# Patient Record
Sex: Female | Born: 1975 | Race: Black or African American | Hispanic: No | Marital: Single | Smoking: Never smoker
Health system: Southern US, Community
[De-identification: ages and names within clinical notes are randomized; demographics above are authoritative.]

## PROBLEM LIST (undated history)

## (undated) DIAGNOSIS — R519 Headache, unspecified: Secondary | ICD-10-CM

## (undated) DIAGNOSIS — K219 Gastro-esophageal reflux disease without esophagitis: Secondary | ICD-10-CM

## (undated) DIAGNOSIS — G40909 Epilepsy, unspecified, not intractable, without status epilepticus: Secondary | ICD-10-CM

## (undated) DIAGNOSIS — M25569 Pain in unspecified knee: Secondary | ICD-10-CM

## (undated) DIAGNOSIS — O139 Gestational [pregnancy-induced] hypertension without significant proteinuria, unspecified trimester: Secondary | ICD-10-CM

## (undated) DIAGNOSIS — M549 Dorsalgia, unspecified: Secondary | ICD-10-CM

## (undated) DIAGNOSIS — L709 Acne, unspecified: Secondary | ICD-10-CM

## (undated) DIAGNOSIS — Z973 Presence of spectacles and contact lenses: Secondary | ICD-10-CM

## (undated) DIAGNOSIS — Z87440 Personal history of urinary (tract) infections: Secondary | ICD-10-CM

## (undated) DIAGNOSIS — R51 Headache: Secondary | ICD-10-CM

## (undated) HISTORY — DX: Epilepsy, unspecified, not intractable, without status epilepticus: G40.909

## (undated) HISTORY — DX: Pain in unspecified knee: M25.569

## (undated) HISTORY — DX: Gastro-esophageal reflux disease without esophagitis: K21.9

## (undated) HISTORY — DX: Presence of spectacles and contact lenses: Z97.3

## (undated) HISTORY — DX: Personal history of urinary (tract) infections: Z87.440

## (undated) HISTORY — DX: Headache: R51

## (undated) HISTORY — DX: Headache, unspecified: R51.9

## (undated) HISTORY — DX: Acne, unspecified: L70.9

## (undated) HISTORY — DX: Dorsalgia, unspecified: M54.9

---

## 2000-12-19 DIAGNOSIS — M549 Dorsalgia, unspecified: Secondary | ICD-10-CM

## 2000-12-19 HISTORY — DX: Dorsalgia, unspecified: M54.9

## 2001-06-14 ENCOUNTER — Encounter: Payer: Self-pay | Admitting: Family Medicine

## 2001-06-14 ENCOUNTER — Encounter: Admission: RE | Admit: 2001-06-14 | Discharge: 2001-06-14 | Payer: Self-pay | Admitting: Family Medicine

## 2002-02-27 ENCOUNTER — Ambulatory Visit (HOSPITAL_COMMUNITY): Admission: RE | Admit: 2002-02-27 | Discharge: 2002-02-27 | Payer: Self-pay | Admitting: Obstetrics and Gynecology

## 2002-02-27 ENCOUNTER — Encounter (INDEPENDENT_AMBULATORY_CARE_PROVIDER_SITE_OTHER): Payer: Self-pay

## 2003-04-15 ENCOUNTER — Encounter: Payer: Self-pay | Admitting: Family Medicine

## 2003-04-15 ENCOUNTER — Encounter: Admission: RE | Admit: 2003-04-15 | Discharge: 2003-04-15 | Payer: Self-pay | Admitting: Family Medicine

## 2003-09-18 ENCOUNTER — Other Ambulatory Visit: Admission: RE | Admit: 2003-09-18 | Discharge: 2003-09-18 | Payer: Self-pay | Admitting: Family Medicine

## 2004-12-23 ENCOUNTER — Other Ambulatory Visit: Admission: RE | Admit: 2004-12-23 | Discharge: 2004-12-23 | Payer: Self-pay | Admitting: Family Medicine

## 2005-04-21 ENCOUNTER — Other Ambulatory Visit: Admission: RE | Admit: 2005-04-21 | Discharge: 2005-04-21 | Payer: Self-pay | Admitting: Obstetrics and Gynecology

## 2005-10-23 ENCOUNTER — Inpatient Hospital Stay (HOSPITAL_COMMUNITY): Admission: AD | Admit: 2005-10-23 | Discharge: 2005-10-23 | Payer: Self-pay | Admitting: Obstetrics and Gynecology

## 2005-10-27 ENCOUNTER — Inpatient Hospital Stay (HOSPITAL_COMMUNITY): Admission: AD | Admit: 2005-10-27 | Discharge: 2005-10-27 | Payer: Self-pay | Admitting: Obstetrics and Gynecology

## 2005-10-28 ENCOUNTER — Encounter (INDEPENDENT_AMBULATORY_CARE_PROVIDER_SITE_OTHER): Payer: Self-pay | Admitting: Specialist

## 2005-10-28 ENCOUNTER — Inpatient Hospital Stay (HOSPITAL_COMMUNITY): Admission: RE | Admit: 2005-10-28 | Discharge: 2005-11-02 | Payer: Self-pay | Admitting: Obstetrics and Gynecology

## 2005-12-19 DIAGNOSIS — M25569 Pain in unspecified knee: Secondary | ICD-10-CM

## 2005-12-19 HISTORY — DX: Pain in unspecified knee: M25.569

## 2006-02-07 ENCOUNTER — Other Ambulatory Visit: Admission: RE | Admit: 2006-02-07 | Discharge: 2006-02-07 | Payer: Self-pay | Admitting: Obstetrics and Gynecology

## 2006-08-30 ENCOUNTER — Other Ambulatory Visit: Admission: RE | Admit: 2006-08-30 | Discharge: 2006-08-30 | Payer: Self-pay | Admitting: Obstetrics and Gynecology

## 2007-03-12 ENCOUNTER — Other Ambulatory Visit: Admission: RE | Admit: 2007-03-12 | Discharge: 2007-03-12 | Payer: Self-pay | Admitting: Obstetrics and Gynecology

## 2007-06-18 ENCOUNTER — Other Ambulatory Visit: Admission: RE | Admit: 2007-06-18 | Discharge: 2007-06-18 | Payer: Self-pay | Admitting: Obstetrics & Gynecology

## 2008-01-02 ENCOUNTER — Other Ambulatory Visit: Admission: RE | Admit: 2008-01-02 | Discharge: 2008-01-02 | Payer: Self-pay | Admitting: Obstetrics and Gynecology

## 2008-06-25 ENCOUNTER — Other Ambulatory Visit: Admission: RE | Admit: 2008-06-25 | Discharge: 2008-06-25 | Payer: Self-pay | Admitting: Obstetrics and Gynecology

## 2009-01-29 ENCOUNTER — Other Ambulatory Visit: Admission: RE | Admit: 2009-01-29 | Discharge: 2009-01-29 | Payer: Self-pay | Admitting: Obstetrics and Gynecology

## 2009-11-11 ENCOUNTER — Ambulatory Visit: Payer: Self-pay | Admitting: Family Medicine

## 2010-02-16 ENCOUNTER — Ambulatory Visit: Payer: Self-pay | Admitting: Family Medicine

## 2011-02-07 ENCOUNTER — Institutional Professional Consult (permissible substitution) (INDEPENDENT_AMBULATORY_CARE_PROVIDER_SITE_OTHER): Payer: 59 | Admitting: Family Medicine

## 2011-02-07 DIAGNOSIS — K5289 Other specified noninfective gastroenteritis and colitis: Secondary | ICD-10-CM

## 2011-02-07 DIAGNOSIS — L708 Other acne: Secondary | ICD-10-CM

## 2011-02-07 DIAGNOSIS — R350 Frequency of micturition: Secondary | ICD-10-CM

## 2011-05-06 NOTE — H&P (Signed)
[facility]  Patient:    ANTHONY, ROLAND Visit Number: [id number] MRN: [id number]          Service Type: DSU Location: [facility] Attending Physician:  Leonard Schwartz Dictated by:   Janine Limbo, M.D. Admit Date:  02/27/2002   CC:         Elsworth Soho, M.D.   History and Physical  HISTORY OF PRESENT ILLNESS:   The patient is a 35 year old female, para 0, who presents with irregular bleeding and an endometrial polyp.   A hydrosonogram was performed and it showed that there was a 1 cm area that was thought to be consistent with a polyp.  The patient had an ultrasound performed in June 2002 and there was evidence of a polyp at that time as well.  The ovaries were thought to be unremarkable.  The uterus measured 7.4 x 5 cm in size.  DRUG ALLERGIES:               None known.  PAST MEDICAL HISTORY:         The patient has a history of headaches, she also has a history of anemia.  She has had chicken pox in the past.  CURRENT MEDICATIONS: 1. Steroids for her skin. 2. Depo Provera for contraception.  SOCIAL HISTORY:               The patient denies cigarette use, alcohol use, and recreational drug use.  REVIEW OF SYSTEMS:            The patient wears glasses.  She has frequent headaches.  FAMILY HISTORY:               The patient has a family history of hypertension, diabetes, and joint problems.  PHYSICAL EXAMINATION:  GENERAL:                      Weight is 172 pounds.  HEENT:                        Within normal limits.  CHEST:                        Clear.  HEART:                        Regular rate and rhythm.  BREASTS:                      Without masses.  ABDOMEN:                      Nontender.  EXTREMITIES:                  Within normal limits.  NEUROLOGIC EXAMINATION:       Grossly normal.  PELVIC EXAMINATION:           External genitalia are normal.  The vagina is normal.  The cervix is nontender.  The  uterus is normal size, shape, and consistency.  Adnexa - no masses.  ASSESSMENT:                   1. Irregular bleeding.                               2. Endometrial polyp.  PLAN:                         The patient will undergo hysteroscopy with dilatation and curettage.  She understands the indications for her procedure and she accepts the risks of, but not limited to, anesthetic complications, bleeding, infections, and possible damage to the surrounding organs. Dictated by:   Janine Limbo, M.D. Attending Physician:  Leonard Schwartz DD:  02/26/02 TD:  02/26/02 Job: [phone] [id number]

## 2011-05-06 NOTE — H&P (Signed)
NAME:  Dawn Goodwin, GLANZ NO.:  [id number]   MEDICAL RECORD NO.:  [id number]          PATIENT TYPE:  INP   LOCATION:  NA                            [facility]ILITY[facility]  [facility]   PHYSICIAN:  Charles A. Delcambre, MDDATE OF BIRTH:  [birth year]   DATE OF ADMISSION:  DATE OF DISCHARGE:                                HISTORY & PHYSICAL   She is to be admitted on October 28, 2005, to undergo cesarean section  secondary to macrosomia and borderline low AFI that she is quite anxious  about, refusing almost to leave the hospital. Originally scheduled for  cesarean section on November 14 but refusing to wait until that time  adamantly. She also states she is having so much pain from the pressure that  she refuses to wait until that time. She has estimated fetal weight on  verbal report to me of 5000 g, 10 pounds 10 ounces, with narrow pelvis, and  for this reason I would elect for and recommend primary cesarean section.  Amniocentesis would be difficult with an AFI in the 8 range as well, and for  this reason we will proceed without amniocentesis. She accepts risks of  prematurity, immature lungs, respiratory distress syndrome, and understands  that we are proceeding for delivery at 38 weeks 3 days estimated gestational  age. EDC is November 08, 2005. She is a 35 year old gravida 1 para 0-0-0-0.   PAST MEDICAL HISTORY:  None.   SURGICAL HISTORY:  Back injury, D&C.   MEDICATIONS:  Prenatal vitamins, Reglan and Phenergan earlier; only prenatal  vitamins at this time.   ALLERGIES:  No known drug allergies.   SOCIAL HISTORY:  No tobacco, ethanol, or drug abuse, or STD history. Not  married, not sexually active at this time. Chlamydia treated initially in  the pregnancy and test of cure was negative.   FAMILY HISTORY:  Hypertension, diabetes; otherwise, no major illnesses.   REVIEW OF SYSTEMS:  Active fetal movement is noted. She is contracting every  5-10 minutes, feels these very  minimally. No rupture of membranes or  bleeding. No headache, scotomata, right upper quadrant pain, fever, chills,  nausea, vomiting, or constipation. She does have slight diarrhea at this  time, has been instructed that she could take Imodium but has not taken any.   LABORATORY:  Blood type A negative, antibody screen negative. VDRL  nonreactive. Rubella immune. Hepatitis B surface antigen negative. HIV  negative. TSH normal. Pap:  ASCUS. GC and chlamydia:  Negative GC, positive  chlamydia, test of cure negative. Cystic fibrosis declined. Group B strep  negative. Antibody screen negative. At 28 weeks, hemoglobin 12.4. At 28  weeks, Glucola, 1-hour, 81. She did receive RhoGAM at 28 weeks.   PHYSICAL EXAMINATION:  GENERAL:  Alert and oriented x3, no distress.  VITAL SIGNS:  Blood pressure 110/70, weight 227 pounds, respirations 18,  pulse 90.  HEENT:  Grossly within normal limits.  NECK:  Supple without thyromegaly or adenopathy.  LUNGS:  Clear bilaterally.  HEART:  Regular rate and rhythm. A 2/6 systolic ejection murmur left sternal  border.  BREASTS:  No masses,  tenderness, discharge, skin, nipple change bilaterally.  ABDOMEN:  Soft and nontender, gravid. Fundal height 41 cm. Fetal heart tones  150s.  PELVIC:  Deferred.  EXTREMITIES:  Minimal to moderate edema, bilateral and symmetrical,  nontender.   ASSESSMENT:  1.  Intrauterine pregnancy to be 38 weeks 3 days upon admission.  2.  Somewhat low for gestation amniotic fluid index in the low 8's.  3.  Extreme pelvic discomfort and the patient refusing to go on to 39 weeks      with delivery.  4.  Macrosomia with estimated fetal weight over 10 pounds 10 ounces, just      over 5000 g likely.  5.  Borderline contracted pelvis.   PLAN:  Admission. N.p.o. past midnight. Preoperative CBC and type and  screen. All questions are answered. She accepts risks of infection,  bleeding, bowel and bladder damage, ureteral damage, blood product  risks  including hepatitis and HIV exposure. She gives informed consent.  Understands risks of prematurity as well and proceeding without  amniocentesis to check lung maturity below 39 weeks, although at 38-and-a-  half weeks she is essentially refusing to leave the hospital if we went into  next week secondary to fear. All questions are answered and she accepts  these recommendations and is scheduled tomorrow. She was given fetal  movement precautions and we will proceed as outlined.      Charles A. Sydnee Cabal, MD  Electronically Signed     CAD/MEDQ  D:  10/27/2005  T:  10/27/2005  Job:  [id number]

## 2011-05-06 NOTE — Op Note (Signed)
[facility]  Patient:    Dawn Goodwin, Dawn Goodwin Visit Number: [id number] MRN: [id number]          Service Type: DSU Location: [facility] Attending Physician:  Leonard Schwartz Dictated by:   Janine Limbo, M.D. Proc. Date: 02/27/02 Admit Date:  02/27/2002                             Operative Report  PREOPERATIVE DIAGNOSES:       1. Irregular uterine bleeding.                               2. Endometrial polyp.  POSTOPERATIVE DIAGNOSES:      Irregular uterine bleeding.  PROCEDURE:                    Hysteroscopy with dilatation and curettage.  SURGEON:                      Janine Limbo, M.D.  ANESTHESIA:                   General by LMA.  DISPOSITION:                  Dawn Goodwin is a 35 year old female who presents with the above mentioned diagnoses.  A hydrosonogram was performed and there was a question of a polyp within the endometrial cavity.  The patient understands the indications for her procedure and she accepts the risks of, but not limited to, anesthetic complications, bleeding, infections, and possible damage to the surrounding organs.  FINDINGS:                     The uterus was normal size, shape, and consistency.  Adnexa:  No masses were appreciated.  The uterine cavity was noted to be perfectly clean.  The tubal ostia were visualized and were felt to be normal.  PROCEDURE:                    The patient was taken to the operating room where she was given a general anesthetic.  The patients abdomen and perineum were prepped with multiple layers of Betadine.  Examination under anesthesia was then performed.  The bladder was drained of urine.  The patient was sterilely draped.  A speculum was placed in the vagina and the cervix was held with a single tooth tenaculum.  A paracervical block was placed using 10 cc of 0.5% Marcaine.  The uterus sounded to 8 cm.  The cervix was gradually dilated. The diagnostic hysteroscope  was inserted and the cavity was carefully inspected.  No polyps were noted.  The tubal ostia were easily seen.  Pictures were taken of the patients endometrial cavity.  The diagnostic hysteroscope was removed and the cavity was sharply curetted.  Hemostasis was noted to be adequate.  The patient was then awakened from her anesthetic and taken to the recovery room in stable condition.  The estimated blood loss was less than 10 cc.  The fluid deficit was 20 cc.  FOLLOWUP INSTRUCTIONS:        The patient will return to see Dr. Stefano Gaul in two to three weeks for follow-up examination.  She was given a prescription for Vicodin and she can take one or two of these tablets q.4h. as needed for  pain.  She understands that she is not to drive.  She can take Tylenol or Advil for mild pain.  She was given a copy of the postoperative instruction sheet as prepared by the [facility] for patients who have undergone hysteroscopy and D&C. Dictated by:   Janine Limbo, M.D. Attending Physician:  Leonard Schwartz DD:  02/27/02 TD:  02/28/02 Job: [phone]

## 2011-05-06 NOTE — Op Note (Signed)
NAME:  Dawn Goodwin, Dawn Goodwin             ACCOUNT NO.:  [id number]   MEDICAL RECORD NO.:  [id number]          PATIENT TYPE:  INP   LOCATION:  9123                          [facility]ILITY:  [facility]   PHYSICIAN:  Charles A. Delcambre, MDDATE OF BIRTH:  1976/08/20   DATE OF PROCEDURE:  10/28/2005  DATE OF DISCHARGE:                                 OPERATIVE REPORT   PREOPERATIVE DIAGNOSES:  1.  Intrauterine pregnancy 38 weeks and 3 days.  2.  Borderline low amniotic fluid.  3.  Macrosomia with estimated fetal weight on ultrasound 10 pounds, 10      ounces, fundal height 42 cm.  4.  Maternal anxiety with regard to waiting any further with borderline      fluid, macrosomia, and pelvic pain, refusing to leave hospital.   POSTOPERATIVE DIAGNOSES:  1.  Intrauterine pregnancy 38 weeks and 3 days.  2.  Borderline low amniotic fluid.  3.  Estimated fetal weight on ultrasound 10 pounds, 10 ounces, fundal height      42 cm.  4.  Maternal anxiety with regard to waiting any further with borderline      fluid, macrosomia, and pelvic pain, refusing to leave hospital.   PROCEDURES:  Primary low transverse cesarean section.   SURGEON:  Dr. Sydnee Cabal   ASSISTANT:  Scrub tech.   ANESTHESIA:  Spinal.   COMPLICATIONS:  None.   ESTIMATED BLOOD LOSS:  700 mL.   SPECIMEN:  Placenta to pathology.   Cord arterial blood gas 7.13, CO2 85, O2 11, bicarb 27, venous blood gas  7.34, CO2 46, O2 37, bicarb 24.  Instrument, sponge, and needle count  correct x 2.   OPERATIVE FINDINGS:  Vigorous female, Apgars 8 and 9 per my recollection from  verbal from the neonatologist in attendance.   DESCRIPTION OF PROCEDURE:  The patient was taken to the operating room,  placed in supine position after spinal was dosed.  Anesthesia was adequate.  Sterile prep and drape was undertaken.  Pfannenstiel incision was made with  a knife, carried down to fascia, and fascia was incised with the knife and  Mayo scissors.  Rectus muscles  were sharply dissected in the midline.  Peritoneum was entered with a hemostat.  Traction was used to extend the  peritoneal incision.  Bladder blade was placed.  The vesicouterine  peritoneum was incised with the Metzenbaum scissors.  Blunt dissection was  used to develop the bladder flap, lower uterine segment transverse incision  was made to amniotomy without damage to the baby.  Traction was used to  extend the incision.  Hand was inserted.  Occiput was brought to the uterine  incision site, fundal pressure with the operator's assistant was used to  deliver the infant in standard fashion without difficulty.  Cord was  clamped, and the infant was shown to the mother and a family member and  taken to the neonatologist in attendance.  Placenta was manually expressed  after cord gases and cord blood was collected.  Placenta was sent to  pathology and 4 cord blood registry blood to be taken per the maternal  request.  The uterus was externalized for repair.  Intrauterine surface was  wiped with a moistened lap.  Then #1 chromic running locking suture was then  used to close the uterus, single layer.  Several figure-of-eight of #1  chromic as well as 2-0 Vicryl were used to achieve hemostasis.  Hemostasis  was excellent.  Bladder flap hemostasis was excellent.  Uterus was  reinternalized; hemostasis was verified.  Subfascial hemostasis was  verified.  Peritoneum was closed with 2-0 chromic.  Subfascial hemostasis  was verified once again, and the fascia was closed with #1 Vicryl running  nonlocking suture.  Irrigation had been carried out in the intraperitoneal  cavity and was carried out in the subcutaneous layer; 3-0 Vicryl interrupted  stitches x 3 were used to close the subcutaneous layer.  Sterile clips were  used to close the skin.  Sterile dressing was applied.  The patient was  taken to recovery with physician in attendance, having tolerated the  procedure well.      Charles A.  Sydnee Cabal, MD  Electronically Signed     CAD/MEDQ  D:  10/28/2005  T:  10/28/2005  Job:  [id number]

## 2011-05-06 NOTE — Discharge Summary (Signed)
NAME:  Dawn Goodwin, Dawn Goodwin             ACCOUNT NO.:  [id number]   MEDICAL RECORD NO.:  [id number]          PATIENT TYPE:  INP   LOCATION:  9123                          [facility]ILITY:  [facility]   PHYSICIAN:  Charles A. Delcambre, MDDATE OF BIRTH:  04-19-1976   DATE OF ADMISSION:  10/28/2005  DATE OF DISCHARGE:  11/02/2005                                 DISCHARGE SUMMARY   PRIMARY DISCHARGE DIAGNOSES:  1.  Intrauterine pregnancy 38 weeks and 3 days.  2.  Borderline low amniotic fluid.  3.  Suspected macrosomia.  4.  Anxiety regarding fluid and macrosomia.  5.  Preeclampsia, late onset.   PROCEDURE:  Primary low transverse cesarean section.   DISPOSITION:  Patient discharged home.  Will follow up in the office in five  days to check blood pressure and repeat PIH panel and CBC and incision  check.  She was given precautions to call for any increased pain or  bleeding, temperature over 100 degrees, incisional drainage or erythema,  headache, scotomata, or right upper quadrant pain.  No lifting greater than  25 pounds for four weeks or driving for two weeks.  She was given  prescriptions for Percocet 5/325 one to two p.o. q.4h. p.r.n. #40 and Tandem  one p.o. daily #30.   LABORATORIES:  Postoperative hematocrit 31, hemoglobin 10.5.  Last PIH panel  SGOT 38, SGPT 47, these down respectively from 50 and 49.  Uric acid 4.5.  Platelets 321, white count 10.2.  Creatinine 0.7.   HOSPITAL COURSE:  Patient was admitted.  Underwent surgery as noted above.  Delivery yielded vigorous female 4020 g, 53 cm, Apgars 8 and 9.  Postoperatively she had routine postoperative course with Foley catheter  removed, voiding without difficulty, pain controlled with p.o. medication.  On day one she was given general diet postoperative day #1 as well.  Had  minimal nausea, no vomiting.  Evening postoperative day #2 she had a low-  grade temperature of 100.5, no localizing signs or symptoms.  Blood pressure  was mildly  elevated that day.  Postoperative day three 136/91 max.  She was  continued under observation and PIH panel did show elevated SGOT at 41.  For  this reason she was observed overnight.  Maximal blood pressures at that  point were 140s/70-80s.  She remained asymptomatic otherwise.  Platelets  were stable 248 on postoperative day four.  Uric acid did decrease to 4.1;  however, SGOT increased to 50 and SGPT rose from 34 to 49.  Blood pressures  had gone up slightly.  For this reason I felt further monitoring in the  hospital was in line in that the patient lives in [place] and I felt  she was unreliable for further follow-up at this point.  She was observed  overnight and continued to be asymptomatic.  SGOT on recheck went down to  38.  SGPT was down slightly to 47 from 49 and uric acid was 4.5 from 4.1.  Platelets were stable at 321.  Blood pressure this morning on postoperative  day five is 133/86 with a max yesterday during the day 155/93.  She is  strictly precautioned, will go home and stay with a sister here in  [place] and return with follow-up as noted above.      Charles A. Sydnee Cabal, MD  Electronically Signed     CAD/MEDQ  D:  11/02/2005  T:  11/02/2005  Job:  [id number]

## 2011-09-22 ENCOUNTER — Other Ambulatory Visit (INDEPENDENT_AMBULATORY_CARE_PROVIDER_SITE_OTHER): Payer: 59

## 2011-09-22 ENCOUNTER — Encounter: Payer: Self-pay | Admitting: Family Medicine

## 2011-09-22 DIAGNOSIS — Z23 Encounter for immunization: Secondary | ICD-10-CM

## 2011-10-28 ENCOUNTER — Ambulatory Visit (INDEPENDENT_AMBULATORY_CARE_PROVIDER_SITE_OTHER): Payer: 59 | Admitting: Family Medicine

## 2011-10-28 ENCOUNTER — Encounter: Payer: Self-pay | Admitting: Family Medicine

## 2011-10-28 VITALS — BP 130/82 | HR 56 | Temp 98.5°F | Wt 201.0 lb

## 2011-10-28 DIAGNOSIS — Z331 Pregnant state, incidental: Secondary | ICD-10-CM

## 2011-10-28 DIAGNOSIS — J069 Acute upper respiratory infection, unspecified: Secondary | ICD-10-CM

## 2011-10-28 LAB — POCT URINE PREGNANCY: Preg Test, Ur: POSITIVE

## 2011-10-28 NOTE — Progress Notes (Signed)
  Subjective:    Patient ID: Dawn Goodwin, female    DOB: 15-Jun-1976, 35 y.o.   MRN: [id number]  HPI Approximately 4 days ago she developed hoarse voice and nasal congestion. She then developed some nasal congestion. Also she is late on her menstrual cycle and has noted breast tenderness and some fatigue.  Review of Systems     Objective:   Physical Exam alert and in no distress. Tympanic membranes and canals are normal. Throat is clear. Tonsils are normal. Neck is supple without adenopathy or thyromegaly. Cardiac exam shows a regular sinus rhythm without murmurs or gallops. Lungs are clear to auscultation. Urine pregnancy test was positive      Assessment & Plan:  URI. IUP Supportive care for the upper respiratory infection. She will call in 7-10 days if not better. We will also refer to [facility] for followup care. I did discuss with her briefly the option she has concerning the pregnancy.

## 2011-10-28 NOTE — Patient Instructions (Signed)
Use Tylenol for aches and pains. He may also have Robitussin-DM for coughing. Call if not better after 7-10 days.

## 2011-10-31 ENCOUNTER — Encounter: Payer: 59 | Admitting: Family Medicine

## 2011-11-04 ENCOUNTER — Other Ambulatory Visit: Payer: Self-pay

## 2011-11-28 ENCOUNTER — Encounter (HOSPITAL_COMMUNITY): Payer: Self-pay

## 2011-11-28 ENCOUNTER — Inpatient Hospital Stay (HOSPITAL_COMMUNITY)
Admission: AD | Admit: 2011-11-28 | Discharge: 2011-11-28 | Disposition: A | Discharge status: Home / Self Care | Payer: 59 | Source: Ambulatory Visit | Attending: Obstetrics & Gynecology | Admitting: Obstetrics & Gynecology

## 2011-11-28 DIAGNOSIS — O99891 Other specified diseases and conditions complicating pregnancy: Secondary | ICD-10-CM | POA: Insufficient documentation

## 2011-11-28 DIAGNOSIS — R109 Unspecified abdominal pain: Secondary | ICD-10-CM | POA: Insufficient documentation

## 2011-11-28 LAB — URINE MICROSCOPIC-ADD ON

## 2011-11-28 LAB — URINALYSIS, ROUTINE W REFLEX MICROSCOPIC
Bilirubin Urine: NEGATIVE
Hgb urine dipstick: NEGATIVE
Ketones, ur: 15 mg/dL — AB
Nitrite: NEGATIVE
Specific Gravity, Urine: 1.02 (ref 1.005–1.030)
Urobilinogen, UA: 0.2 mg/dL (ref 0.0–1.0)

## 2011-11-28 NOTE — ED Notes (Signed)
Pt had to leave AMA- she needed to pick-up her son and could not wait any longer;

## 2011-11-28 NOTE — ED Provider Notes (Signed)
History     CSN: [id number] Arrival date & time: 11/28/2011 12:06 PM   None     Chief Complaint  Patient presents with  . Abdominal Pain    HPI Dawn Goodwin is a 35 y.o. female early pregnant with abdominal pain. She started her prenatal care with [facility] and went back today but was instructed to come to [facility]. Medical screening exam complete and patient is stable to await private OB.   Past Medical History  Diagnosis Date  . Acne   . No pertinent past medical history   . Hypertension     Past Surgical History  Procedure Date  . Dilation and curettage of uterus     No family history on file.  History  Substance Use Topics  . Smoking status: Never Smoker   . Smokeless tobacco: Not on file  . Alcohol Use: No    OB History    Grav Para Term Preterm Abortions TAB SAB Ect Mult Living   2 1 1       1       Review of Systems  Allergies  Cyclinex  Home Medications  No current outpatient prescriptions on file.  BP 122/67  Pulse 90  Temp(Src) 98.5 F (36.9 C) (Oral)  Resp 18  Ht 5\' 6"  (1.676 m)  Wt 203 lb 6.4 oz (92.262 kg)  BMI 32.83 kg/m2  SpO2 99%  LMP 09/21/2011  Physical Exam  Nursing note and vitals reviewed. Constitutional: She is oriented to person, place, and time. She appears well-developed and well-nourished. No distress.  HENT:  Head: Normocephalic.  Eyes: EOM are normal.  Neck: Neck supple.  Cardiovascular: Normal rate.   Pulmonary/Chest: Effort normal.  Abdominal: Soft.       Tender with palpation lower abdomen. No rebound or guarding.  Musculoskeletal: Normal range of motion.  Neurological: She is alert and oriented to person, place, and time. No cranial nerve deficit.  Skin: Skin is warm and dry.  Psychiatric: She has a normal mood and affect. Her behavior is normal. Judgment and thought content normal.   Medical screening exam complete. ED Course  Procedures          [place], NP 11/28/11 [phone]

## 2011-11-28 NOTE — Progress Notes (Signed)
Patient states that she has been having upper abdominal pain since last week off and on. Was seen by her MD today (a female across the street, cannot remember the name or practice). Was told if pain continues to come to [facility]. No bleeding

## 2011-12-20 HISTORY — PX: DILATION AND CURETTAGE OF UTERUS: SHX78

## 2011-12-25 ENCOUNTER — Encounter (HOSPITAL_COMMUNITY): Payer: Self-pay | Admitting: *Deleted

## 2011-12-25 ENCOUNTER — Inpatient Hospital Stay (HOSPITAL_COMMUNITY)
Admission: AD | Admit: 2011-12-25 | Discharge: 2011-12-25 | Disposition: A | Discharge status: Home / Self Care | Payer: 59 | Source: Ambulatory Visit | Attending: Obstetrics & Gynecology | Admitting: Obstetrics & Gynecology

## 2011-12-25 ENCOUNTER — Inpatient Hospital Stay (HOSPITAL_COMMUNITY): Payer: 59

## 2011-12-25 DIAGNOSIS — IMO0002 Reserved for concepts with insufficient information to code with codable children: Secondary | ICD-10-CM | POA: Insufficient documentation

## 2011-12-25 DIAGNOSIS — A5901 Trichomonal vulvovaginitis: Secondary | ICD-10-CM

## 2011-12-25 DIAGNOSIS — O021 Missed abortion: Secondary | ICD-10-CM | POA: Insufficient documentation

## 2011-12-25 HISTORY — DX: Gestational (pregnancy-induced) hypertension without significant proteinuria, unspecified trimester: O13.9

## 2011-12-25 LAB — CBC
Hemoglobin: 12.4 g/dL (ref 12.0–15.0)
MCH: 30.4 pg (ref 26.0–34.0)
MCHC: 34 g/dL (ref 30.0–36.0)
MCV: 89.5 fL (ref 78.0–100.0)
Platelets: 221 10*3/uL (ref 150–400)
RBC: 4.08 MIL/uL (ref 3.87–5.11)

## 2011-12-25 LAB — WET PREP, GENITAL: Yeast Wet Prep HPF POC: NONE SEEN

## 2011-12-25 MED ORDER — METRONIDAZOLE 500 MG PO TABS: 500.0000 mg | ORAL_TABLET | Freq: Two times a day (BID) | ORAL | Status: DC

## 2011-12-25 MED ORDER — RHO D IMMUNE GLOBULIN 1500 UNIT/2ML IJ SOLN
300.0000 ug | Freq: Once | INTRAMUSCULAR | Status: AC
  Administered 2011-12-25: 300 ug via INTRAMUSCULAR
  Filled 2011-12-25: qty 2

## 2011-12-25 NOTE — Discharge Instructions (Signed)
CALL THE OFFICE AT 8 AM AND TELL THEM YOU WERE EVALUATED HERE FOR BLEEDING IN PREGNANCY AND THE PREGNANCY HAS NO HEART BEAT. WE SPOKE WITH DR. KAPLAN AND HE WANTS YOU TO HAVE FOLLOW UP IN THE OFFICE WITH DR. Claiborne Billings IF POSSIBLE.  Trichomoniasis Trichomoniasis is an infection, caused by the Trichomonas organism, that affects both women and men. In women, the outer female genitalia and the vagina are affected. In men, the penis is mainly affected, but the prostate and other reproductive organs can also be involved. Trichomoniasis is a sexually transmitted disease (STD) and is most often passed to another person through sexual contact. The majority of people who get trichomoniasis do so from a sexual encounter and are also at risk for other STDs. CAUSES   Sexual intercourse with an infected partner.   It can be present in swimming pools or hot tubs.  SYMPTOMS   Abnormal gray-green frothy vaginal discharge in women.   Vaginal itching and irritation in women.   Itching and irritation of the area outside the vagina in women.   Penile discharge with or without pain in males.   Inflammation of the urethra (urethritis), causing painful urination.   Bleeding after sexual intercourse.  RELATED COMPLICATIONS  Pelvic inflammatory disease.   Infection of the uterus (endometritis).   Infertility.   Tubal (ectopic) pregnancy.   It can be associated with other STDs, including gonorrhea and chlamydia, hepatitis B, and HIV.  COMPLICATIONS DURING PREGNANCY  Early (premature) delivery.   Premature rupture of the membranes (PROM).   Low birth weight.  DIAGNOSIS   Visualization of Trichomonas under the microscope from the vagina discharge.   Ph of the vagina greater than 4.5, tested with a test tape.   Trich Rapid Test.   Culture of the organism, but this is not usually needed.   It may be found on a Pap test.   Having a "strawberry cervix,"which means the cervix looks very red like a  strawberry.  TREATMENT   You may be given medication to fight the infection. Inform your caregiver if you could be or are pregnant. Some medications used to treat the infection should not be taken during pregnancy.   Over-the-counter medications or creams to decrease itching or irritation may be recommended.   Your sexual partner will need to be treated if infected.  HOME CARE INSTRUCTIONS   Take all medication prescribed by your caregiver.   Take over-the-counter medication for itching or irritation as directed by your caregiver.   Do not have sexual intercourse while you have the infection.   Do not douche or wear tampons.   Discuss your infection with your partner, as your partner may have acquired the infection from you. Or, your partner may have been the person who transmitted the infection to you.   Have your sex partner examined and treated if necessary.   Practice safe, informed, and protected sex.   See your caregiver for other STD testing.  SEEK MEDICAL CARE IF:   You still have symptoms after you finish the medication.   You have an oral temperature above 102 F (38.9 C).   You develop belly (abdominal) pain.   You have pain when you urinate.   You have bleeding after sexual intercourse.   You develop a rash.   The medication makes you sick or makes you throw up (vomit).  Document Released: 05/31/2001 Document Revised: 08/17/2011 Document Reviewed: 06/26/2009 [facility] Patient Information 2012 [place], [place].Trichomoniasis Trichomoniasis is an infection, caused  by the Trichomonas organism, that affects both women and men. In women, the outer female genitalia and the vagina are affected. In men, the penis is mainly affected, but the prostate and other reproductive organs can also be involved. Trichomoniasis is a sexually transmitted disease (STD) and is most often passed to another person through sexual contact. The majority of people who get trichomoniasis do so  from a sexual encounter and are also at risk for other STDs. CAUSES   Sexual intercourse with an infected partner.   It can be present in swimming pools or hot tubs.  SYMPTOMS   Abnormal gray-green frothy vaginal discharge in women.   Vaginal itching and irritation in women.   Itching and irritation of the area outside the vagina in women.   Penile discharge with or without pain in males.   Inflammation of the urethra (urethritis), causing painful urination.   Bleeding after sexual intercourse.  RELATED COMPLICATIONS  Pelvic inflammatory disease.   Infection of the uterus (endometritis).   Infertility.   Tubal (ectopic) pregnancy.   It can be associated with other STDs, including gonorrhea and chlamydia, hepatitis B, and HIV.  COMPLICATIONS DURING PREGNANCY  Early (premature) delivery.   Premature rupture of the membranes (PROM).   Low birth weight.  DIAGNOSIS   Visualization of Trichomonas under the microscope from the vagina discharge.   Ph of the vagina greater than 4.5, tested with a test tape.   Trich Rapid Test.   Culture of the organism, but this is not usually needed.   It may be found on a Pap test.   Having a "strawberry cervix,"which means the cervix looks very red like a strawberry.  TREATMENT   You may be given medication to fight the infection. Inform your caregiver if you could be or are pregnant. Some medications used to treat the infection should not be taken during pregnancy.   Over-the-counter medications or creams to decrease itching or irritation may be recommended.   Your sexual partner will need to be treated if infected.  HOME CARE INSTRUCTIONS   Take all medication prescribed by your caregiver.   Take over-the-counter medication for itching or irritation as directed by your caregiver.   Do not have sexual intercourse while you have the infection.   Do not douche or wear tampons.   Discuss your infection with your partner, as  your partner may have acquired the infection from you. Or, your partner may have been the person who transmitted the infection to you.   Have your sex partner examined and treated if necessary.   Practice safe, informed, and protected sex.   See your caregiver for other STD testing.  SEEK MEDICAL CARE IF:   You still have symptoms after you finish the medication.   You have an oral temperature above 102 F (38.9 C).   You develop belly (abdominal) pain.   You have pain when you urinate.   You have bleeding after sexual intercourse.   You develop a rash.   The medication makes you sick or makes you throw up (vomit).  Document Released: 05/31/2001 Document Revised: 08/17/2011 Document Reviewed: 06/26/2009 [facility] Patient Information 2012 [place], [place].

## 2011-12-25 NOTE — ED Provider Notes (Signed)
History     CSN: [id number]  Arrival date & time 12/25/11  1926   None     Chief Complaint  Patient presents with  . Vaginal Bleeding    HPI Dawn Goodwin is a 36 y.o. female who presents to [facility] for vaginal bleeding that started last week as spotting. Has gotten heavier over the past 2 days with cramping off and on. Was with FOB for 6 months but no longer together. Normal pap smears, no history of STD's.  Denies fever or chills or any other problems. The history was provided by the patient.   Past Medical History  Diagnosis Date  . Acne   . No pertinent past medical history   . Hypertension   . Pregnancy induced hypertension     Past Surgical History  Procedure Date  . Dilation and curettage of uterus   . Cesarean section     Family History  Problem Relation Age of Onset  . Hypertension Mother   . Hypertension Maternal Grandmother     History  Substance Use Topics  . Smoking status: Never Smoker   . Smokeless tobacco: Not on file  . Alcohol Use: No    OB History    Grav Para Term Preterm Abortions TAB SAB Ect Mult Living   2 1 1       1       Review of Systems  Constitutional: Negative for fever, chills, diaphoresis and fatigue.  HENT: Negative for ear pain, congestion, sore throat, facial swelling, neck pain, neck stiffness, dental problem and sinus pressure.   Eyes: Negative for photophobia, pain and discharge.  Respiratory: Negative for cough, chest tightness and wheezing.   Gastrointestinal: Negative for nausea, vomiting, abdominal pain, diarrhea, constipation and abdominal distention.  Genitourinary: Positive for vaginal bleeding. Negative for dysuria, frequency, flank pain and difficulty urinating.  Musculoskeletal: Positive for back pain. Negative for myalgias and gait problem.  Skin: Negative for color change and rash.  Neurological: Negative for dizziness, speech difficulty, weakness, light-headedness, numbness and headaches.    Psychiatric/Behavioral: Negative for confusion and agitation.    Allergies  Cyclinex  Home Medications  No current outpatient prescriptions on file.  BP 125/32  Pulse 99  Temp(Src) 99.5 F (37.5 C) (Oral)  Resp 20  Ht 5\' 7"  (1.702 m)  Wt 203 lb (92.08 kg)  BMI 31.79 kg/m2  SpO2 99%  LMP 09/21/2011  Physical Exam  Nursing note and vitals reviewed. Constitutional: She is oriented to person, place, and time. She appears well-developed and well-nourished.  HENT:  Head: Normocephalic.  Eyes: EOM are normal.  Neck: Neck supple.  Cardiovascular: Normal rate.   Pulmonary/Chest: Effort normal.  Abdominal: Soft. There is no tenderness.       Unable to doppler FHT.  Genitourinary:       External genitalia without lesions. Small amount of blood vaginal vault. Cervix closed, long, no CMT, mildly tender left adnexa. Uterus slightly enlarged.  Musculoskeletal: Normal range of motion.  Neurological: She is alert and oriented to person, place, and time. No cranial nerve deficit.  Skin: Skin is warm and dry.  Psychiatric: She has a normal mood and affect. Her behavior is normal. Judgment and thought content normal.   Results for orders placed during the hospital encounter of 12/25/11 (from the past 24 hour(s))  ABO/RH     Status: Normal   Collection Time   12/25/11  8:43 PM      Component Value Range   ABO/RH(D) A  NEG    CBC     Status: Normal   Collection Time   12/25/11  8:44 PM      Component Value Range   WBC 8.0  4.0 - 10.5 (K/uL)   RBC 4.08  3.87 - 5.11 (MIL/uL)   Hemoglobin 12.4  12.0 - 15.0 (g/dL)   HCT 04.5  40.9 - 81.1 (%)   MCV 89.5  78.0 - 100.0 (fL)   MCH 30.4  26.0 - 34.0 (pg)   MCHC 34.0  30.0 - 36.0 (g/dL)   RDW 91.4  78.2 - 95.6 (%)   Platelets 221  150 - 400 (K/uL)  HCG, QUANTITATIVE, PREGNANCY     Status: Abnormal   Collection Time   12/25/11  8:44 PM      Component Value Range   hCG, Beta Chain, Quant, S 1124 (*) <5 (mIU/mL)  WET PREP, GENITAL     Status:  Abnormal   Collection Time   12/25/11  9:00 PM      Component Value Range   Yeast, Wet Prep NONE SEEN  NONE SEEN    Trich, Wet Prep MODERATE (*) NONE SEEN    Clue Cells, Wet Prep FEW (*) NONE SEEN    WBC, Wet Prep HPF POC FEW (*) NONE SEEN    US Ob Comp Less 14 Wks  12/25/2011  *RADIOLOGY REPORT*  Clinical Data: Vaginal bleeding without fetal heart tones.  The estimated gestational age by last menstrual period 13 weeks 4 days.  OBSTETRIC <14 WK [place] AND TRANSVAGINAL OB US  Technique:  Both transabdominal and transvaginal ultrasound examinations were performed for complete evaluation of the gestation as well as the maternal uterus, adnexal regions, and pelvic cul-de-sac.  Transvaginal technique was performed to assess early pregnancy.  Comparison:  None.  Intrauterine gestational sac:  Abnormal intrauterine gestational sac.  Gestational sac is single.  The gestational sac has an oblong contour. Yolk sac: Present but abnormal in configuration. Embryo: Present. Cardiac Activity: None.  CRL: 23   mm  9   w  zero   d Maternal uterus/adnexae: Negative for subchorionic hemorrhage.  Physiologic appearance of the ovaries bilaterally.  No free fluid.  IMPRESSION: Single intrauterine pregnancy without fetal heart tones and irregular/oblong gestational sac.  Findings are compatible with failed early pregnancy.  Original Report Authenticated By: Andreas Newport, M.D.   US Ob Transvaginal  12/25/2011  *RADIOLOGY REPORT*  Clinical Data: Vaginal bleeding without fetal heart tones.  The estimated gestational age by last menstrual period 13 weeks 4 days.  OBSTETRIC <14 WK [place] AND TRANSVAGINAL OB US  Technique:  Both transabdominal and transvaginal ultrasound examinations were performed for complete evaluation of the gestation as well as the maternal uterus, adnexal regions, and pelvic cul-de-sac.  Transvaginal technique was performed to assess early pregnancy.  Comparison:  None.  Intrauterine gestational sac:  Abnormal  intrauterine gestational sac.  Gestational sac is single.  The gestational sac has an oblong contour. Yolk sac: Present but abnormal in configuration. Embryo: Present. Cardiac Activity: None.  CRL: 23   mm  9   w  zero   d Maternal uterus/adnexae: Negative for subchorionic hemorrhage.  Physiologic appearance of the ovaries bilaterally.  No free fluid.  IMPRESSION: Single intrauterine pregnancy without fetal heart tones and irregular/oblong gestational sac.  Findings are compatible with failed early pregnancy.  Original Report Authenticated By: Andreas Newport, M.D.    Assessment:  Missed AB   Trichomonas vaginitis  Plan:  Discussed clinical, lab and  ultrasound findings with Dr. Arlyce Dice   Rhogam    Flagyl Rx   Follow up in the office in am to discuss options of IUFD treatment plan  ED Course: Discussed findings in detail with the patient and need for follow up in the office tomorrow. Patient voices understanding.  Procedures   MDM          Kerrie Buffalo, NP 12/25/11 2153

## 2011-12-25 NOTE — Progress Notes (Signed)
Pt had pink spotting since last Saturday.  Started with increased pink bleeding today.  Had sm amt blood throughout the day today.  States she has upper abd   Cramping and pressure in low abd.

## 2011-12-25 NOTE — Progress Notes (Signed)
Pt sttes she has been having cramping  For a couple months-has had vaginal spotting progressing to bleeding and has been passing little clots for the last 2 days

## 2011-12-26 ENCOUNTER — Encounter (HOSPITAL_COMMUNITY): Payer: Self-pay

## 2011-12-26 ENCOUNTER — Other Ambulatory Visit: Payer: Self-pay | Admitting: Obstetrics & Gynecology

## 2011-12-26 ENCOUNTER — Encounter (HOSPITAL_COMMUNITY)
Admission: AD | Disposition: A | Discharge status: Home / Self Care | Payer: Self-pay | Source: Ambulatory Visit | Attending: Obstetrics & Gynecology

## 2011-12-26 ENCOUNTER — Ambulatory Visit (HOSPITAL_COMMUNITY): Admission: RE | Admit: 2011-12-26 | Payer: 59 | Source: Ambulatory Visit | Admitting: Obstetrics & Gynecology

## 2011-12-26 ENCOUNTER — Inpatient Hospital Stay (HOSPITAL_COMMUNITY): Payer: 59

## 2011-12-26 ENCOUNTER — Encounter (HOSPITAL_COMMUNITY): Payer: Self-pay | Admitting: Anesthesiology

## 2011-12-26 ENCOUNTER — Inpatient Hospital Stay (HOSPITAL_COMMUNITY): Payer: 59 | Admitting: Anesthesiology

## 2011-12-26 ENCOUNTER — Ambulatory Visit (HOSPITAL_COMMUNITY)
Admission: AD | Admit: 2011-12-26 | Discharge: 2011-12-26 | Disposition: A | Discharge status: Home / Self Care | Payer: 59 | Source: Ambulatory Visit | Attending: Obstetrics & Gynecology | Admitting: Obstetrics & Gynecology

## 2011-12-26 DIAGNOSIS — O021 Missed abortion: Principal | ICD-10-CM | POA: Insufficient documentation

## 2011-12-26 HISTORY — PX: DILATION AND EVACUATION: SHX1459

## 2011-12-26 LAB — CBC
MCH: 31.5 pg (ref 26.0–34.0)
MCHC: 34.8 g/dL (ref 30.0–36.0)
Platelets: 231 10*3/uL (ref 150–400)

## 2011-12-26 LAB — RH IG WORKUP (INCLUDES ABO/RH)
Antibody Screen: NEGATIVE
Unit division: 0

## 2011-12-26 LAB — GC/CHLAMYDIA PROBE AMP, GENITAL
Chlamydia, DNA Probe: NEGATIVE
GC Probe Amp, Genital: NEGATIVE

## 2011-12-26 SURGERY — DILATION AND EVACUATION, UTERUS
Anesthesia: Monitor Anesthesia Care | Site: Vagina | Wound class: Clean Contaminated

## 2011-12-26 MED ORDER — METOCLOPRAMIDE HCL 5 MG/ML IJ SOLN: 10.0000 mg | Freq: Once | INTRAMUSCULAR | Status: DC | PRN

## 2011-12-26 MED ORDER — ONDANSETRON HCL 4 MG/2ML IJ SOLN
INTRAMUSCULAR | Status: DC | PRN
  Administered 2011-12-26: 4 mg via INTRAVENOUS

## 2011-12-26 MED ORDER — FENTANYL CITRATE 0.05 MG/ML IJ SOLN: 25.0000 ug | INTRAMUSCULAR | Status: DC | PRN

## 2011-12-26 MED ORDER — KETOROLAC TROMETHAMINE 30 MG/ML IJ SOLN
INTRAMUSCULAR | Status: DC | PRN
  Administered 2011-12-26: 30 mg via INTRAVENOUS

## 2011-12-26 MED ORDER — FAMOTIDINE IN NACL 20-0.9 MG/50ML-% IV SOLN
INTRAVENOUS | Status: AC
  Filled 2011-12-26: qty 50

## 2011-12-26 MED ORDER — FENTANYL CITRATE 0.05 MG/ML IJ SOLN
INTRAMUSCULAR | Status: DC | PRN
  Administered 2011-12-26: 150 ug via INTRAVENOUS

## 2011-12-26 MED ORDER — CEFAZOLIN SODIUM-DEXTROSE 2-3 GM-% IV SOLR
2.0000 g | Freq: Once | INTRAVENOUS | Status: AC
  Administered 2011-12-26: 2 g via INTRAVENOUS
  Filled 2011-12-26: qty 50

## 2011-12-26 MED ORDER — CITRIC ACID-SODIUM CITRATE 334-500 MG/5ML PO SOLN
ORAL | Status: AC
  Filled 2011-12-26: qty 15

## 2011-12-26 MED ORDER — PROPOFOL 10 MG/ML IV EMUL
INTRAVENOUS | Status: DC | PRN
  Administered 2011-12-26: 60 mg via INTRAVENOUS

## 2011-12-26 MED ORDER — PROPOFOL 10 MG/ML IV EMUL
INTRAVENOUS | Status: DC | PRN
  Administered 2011-12-26: 100 ug/kg/min via INTRAVENOUS

## 2011-12-26 MED ORDER — LIDOCAINE HCL 1 % IJ SOLN
INTRAMUSCULAR | Status: DC | PRN
  Administered 2011-12-26: 15 mL
  Administered 2011-12-26: 10 mL

## 2011-12-26 MED ORDER — MIDAZOLAM HCL 5 MG/5ML IJ SOLN
INTRAMUSCULAR | Status: DC | PRN
  Administered 2011-12-26: 2 mg via INTRAVENOUS

## 2011-12-26 MED ORDER — RHO D IMMUNE GLOBULIN 1500 UNIT/2ML IJ SOLN
300.0000 ug | Freq: Once | INTRAMUSCULAR | Status: DC
  Filled 2011-12-26: qty 2

## 2011-12-26 MED ORDER — LACTATED RINGERS IV SOLN
INTRAVENOUS | Status: DC
  Administered 2011-12-26: 15:00:00 via INTRAVENOUS

## 2011-12-26 SURGICAL SUPPLY — 17 items
CATH ROBINSON RED A/P 16FR (CATHETERS) ×2 IMPLANT
CLOTH BEACON ORANGE TIMEOUT ST (SAFETY) ×2 IMPLANT
DECANTER SPIKE VIAL GLASS SM (MISCELLANEOUS) ×2 IMPLANT
GLOVE BIO SURGEON STRL SZ7.5 (GLOVE) ×4 IMPLANT
GOWN PREVENTION PLUS LG XLONG (DISPOSABLE) ×2 IMPLANT
KIT BERKELEY 1ST TRIMESTER 3/8 (MISCELLANEOUS) ×2 IMPLANT
NEEDLE SPNL 22GX3.5 QUINCKE BK (NEEDLE) ×2 IMPLANT
NS IRRIG 1000ML POUR BTL (IV SOLUTION) ×2 IMPLANT
PACK VAGINAL MINOR WOMEN LF (CUSTOM PROCEDURE TRAY) ×2 IMPLANT
PAD PREP 24X48 CUFFED NSTRL (MISCELLANEOUS) ×2 IMPLANT
SET BERKELEY SUCTION TUBING (SUCTIONS) ×2 IMPLANT
SYR CONTROL 10ML LL (SYRINGE) ×2 IMPLANT
TOWEL OR 17X24 6PK STRL BLUE (TOWEL DISPOSABLE) ×4 IMPLANT
VACURETTE 10 RIGID CVD (CANNULA) IMPLANT
VACURETTE 7MM CVD STRL WRAP (CANNULA) IMPLANT
VACURETTE 8 RIGID CVD (CANNULA) IMPLANT
VACURETTE 9 RIGID CVD (CANNULA) IMPLANT

## 2011-12-26 NOTE — Progress Notes (Signed)
Pt states intermittent generalized abdominal pain. Bleeding small amount today per pt. Changes pad with every void for sanitary reasons.

## 2011-12-26 NOTE — Transfer of Care (Signed)
Immediate Anesthesia Transfer of Care Note  Patient: Dawn Goodwin  Procedure(s) Performed:  DILATATION AND EVACUATION  Patient Location: PACU  Anesthesia Type: MAC  Level of Consciousness: awake, alert  and oriented  Airway & Oxygen Therapy: Patient Spontanous Breathing and Patient connected to nasal cannula oxygen  Post-op Assessment: Report given to PACU RN  Post vital signs: stable  Complications: No apparent anesthesia complications

## 2011-12-26 NOTE — Op Note (Addendum)
Preoperative diagnoses-  #1-first trimester pregnancy loss  #2-blood type A-negative  Procedures-  Suction dilatation and curettage for evacuation of first trimester pregnancy loss  Surgeon-Dr. Aldona Bar  Anesthesia-Gen. Plus paracervical block with 1% Xylocaine with epinephrine  History-this 36 year old gravida 2 para 1 had been followed in the office, last seen approximately 3 weeks ago. At that time she was supposed to be about [redacted] weeks gestation. She presented to maternity admissions on the evening of 1/6 and upon evaluation was found to have a first trimester pregnancy loss with a fetal pole that was approximately [redacted] weeks gestational size - at what should have been [redacted] weeks gestation. She was told to come to our office today for evaluation.  I saw the patient in the office -- her cervix was dilated to slightly less than a centimeter and she was having some vaginal bleeding.  I was able to enter at and get the ultrasound report from her visit to maternity admission was on 1/6. I talked with the patient about a first trimester pregnancy loss and benefit of doing a suction dilatation and curettage to evacuate the uterus. She was in complete agreement with this and indeed had expected this on evening of 1/6 during her previous visit to maternity admissions. She did request a second ultrasound for confirmation.  The patient was sent to maternity admissions where a second ultrasound for confirmation was obtained. After further discussion with the patient preparations were made for a first trimester pregnancy loss -suction dilatation curettage and as her blood type is A-  --  Postpartum RhoGAM.  (Review of her record from her visit to MAU on 12/25/11 shows she did received RhoGam at that time--before she was discharged and told to come to the office today.)  Procedure-patient was taken to the operating room where after the satisfactory induction of general anesthesia she was prepped and draped having  placed in the modified lithotomy position a short Allen stirrups. She did receive 2 g of Ancef preoperatively IV. At this time a speculum was placed in the vagina after the bladder was drained of clear urine with a red rubber catheter in and out fashion. A single-tooth tenaculum was placed on the anterior lip of the cervix and a paracervical block was carried out using approximately 15 cc of 1% Xylocaine without epinephrine.  At this time the internal os was dilated to a #31 Pratt dilator without difficulty. Thereafter using a #10 suction curette the cavity was thoroughly gently and systematically evacuated of all products of conception. This was confirmed using a small standard curette and re\re suctioning produced no additional tissue. At this time the procedure was felt to be complete. Examination found the uterus to be upper limits of normal size and firm and bleeding was minimal.  The patient was transported to recovery room and will be discharged home upon recovery with appropriate instructions and follow up in the office will take place in approximately one to 2 weeks time.  All counts correct x2. Pathologic specimen consistent with products of conception.

## 2011-12-26 NOTE — Progress Notes (Signed)
Bleeding, started a wk ago Sat.  Was here, failed preg.   Saw Dr Aldona Bar this morning,  Pt questioning if truly failed, since D&C not offered/done. Sent for confirmation/procedure

## 2011-12-26 NOTE — ED Provider Notes (Addendum)
36 year old G2P1, prev C/S 2006, with First Trimester Pregnancy Loss for Suction D & C for evacuation of Pregnancy Loss.  Has been NPO all day.  Was seen in [facility]U last evening where Dx made by U/S -- should be 13 weeks but U/S showed 9 week FP with no FH and patient presented with bleeding.  Told to come to office today where I saw her about 11:30 AM.    ALLERGIC TO TETRACYCLINE  PE  VS stable Chest -  Clear CV - RR without Murmur Abd:  Negative CX:  Os sl open with blood per os Corpus:  9 week size  IMP:  First Trimester Pregnancy Loss,  Blood Type A Negative Plan:  Patient requested an second ultrasound for confirmation.  Then will proceed to OR for Suction D & C for evacuation of First Trimester Pregnancy Loss.  Will need RhoGam post op and will use Ancef, 2gm IV, pre op.

## 2011-12-26 NOTE — Anesthesia Postprocedure Evaluation (Signed)
Anesthesia Post Note  Patient: Dawn Goodwin  Procedure(s) Performed:  DILATATION AND EVACUATION  Anesthesia type: MAC  Patient location: PACU  Post pain: Pain level controlled  Post assessment: Post-op Vital signs reviewed  Last Vitals:  Filed Vitals:   12/26/11 1244  BP: 126/79  Pulse: 84  Temp: 37.1 C  Resp: 20    Post vital signs: Reviewed  Level of consciousness: sedated  Complications: No apparent anesthesia complications

## 2011-12-26 NOTE — Anesthesia Preprocedure Evaluation (Signed)
Anesthesia Evaluation  Patient identified by MRN, date of birth, ID band Patient awake    Reviewed: Allergy & Precautions, H&P , NPO status , Patient's Chart, lab work & pertinent test results  Airway Mallampati: III TM Distance: >3 FB Neck ROM: full    Dental No notable dental hx. (+) Teeth Intact   Pulmonary neg pulmonary ROS,  clear to auscultation  Pulmonary exam normal       Cardiovascular neg cardio ROS regular Normal Prior Hx/o PIH   Neuro/Psych Negative Neurological ROS  Negative Psych ROS   GI/Hepatic negative GI ROS, Neg liver ROS,   Endo/Other  Negative Endocrine ROS  Renal/GU negative Renal ROS  Genitourinary negative   Musculoskeletal negative musculoskeletal ROS (+)   Abdominal Normal abdominal exam  (+)   Peds  Hematology negative hematology ROS (+)   Anesthesia Other Findings   Reproductive/Obstetrics (+) Pregnancy                           Anesthesia Physical Anesthesia Plan  ASA: I  Anesthesia Plan: MAC   Post-op Pain Management:    Induction:   Airway Management Planned:   Additional Equipment:   Intra-op Plan:   Post-operative Plan:   Informed Consent: I have reviewed the patients History and Physical, chart, labs and discussed the procedure including the risks, benefits and alternatives for the proposed anesthesia with the patient or authorized representative who has indicated his/her understanding and acceptance.     Plan Discussed with: Anesthesiologist, CRNA and Surgeon  Anesthesia Plan Comments:         Anesthesia Quick Evaluation

## 2011-12-27 ENCOUNTER — Encounter (HOSPITAL_COMMUNITY): Payer: Self-pay | Admitting: Obstetrics & Gynecology

## 2012-01-20 DEATH — deceased

## 2012-02-15 ENCOUNTER — Encounter: Payer: Self-pay | Admitting: Medical

## 2012-02-15 ENCOUNTER — Ambulatory Visit (INDEPENDENT_AMBULATORY_CARE_PROVIDER_SITE_OTHER): Payer: 59 | Admitting: Medical

## 2012-02-15 VITALS — BP 112/80 | HR 68 | Temp 98.3°F | Resp 16 | Wt 198.0 lb

## 2012-02-15 DIAGNOSIS — N898 Other specified noninflammatory disorders of vagina: Secondary | ICD-10-CM

## 2012-02-15 DIAGNOSIS — Z202 Contact with and (suspected) exposure to infections with a predominantly sexual mode of transmission: Secondary | ICD-10-CM

## 2012-02-15 DIAGNOSIS — L293 Anogenital pruritus, unspecified: Secondary | ICD-10-CM

## 2012-02-15 LAB — POCT URINALYSIS DIPSTICK
Ketones, UA: NEGATIVE
Leukocytes, UA: NEGATIVE
Protein, UA: NEGATIVE
Urobilinogen, UA: NEGATIVE
pH, UA: 5

## 2012-02-15 LAB — POCT WET PREP (WET MOUNT): KOH Wet Prep POC: POSITIVE

## 2012-02-15 MED ORDER — FLUCONAZOLE 150 MG PO TABS: ORAL_TABLET | ORAL | Status: DC

## 2012-02-15 NOTE — Progress Notes (Signed)
  Subjective:   HPI Dawn Goodwin is a 36 y.o. female who presents for c/o vaginal itching.  Had unprotected sex once recently.  Has been using condoms in general.  Had D&C 12/26/11, and after that has had mild itching in the vaginal area.  She notes no discharge, no odor.  Not sure about redness.   Currently has 1 sexual partner x 10 years.  She lives 2 hours away from partner, so she does have some concern for STD screening.   Last screening 10/12.  Has had chlamydia in the past age 51yo.  She does note hx/o yeast infection recurrent given that she was on long term antibiotic for skin.  No other aggravating or relieving factors.  No other c/o.  The following portions of the patient's history were reviewed and updated as appropriate: allergies, current medications, past family history, past medical history, past social history, past surgical history and problem list.   Review of Systems Gen.: No fever, chills, sweats, weight change Skin: No rash GI: Negative GU: Negative      Objective:   Physical Exam  General appearance: alert, no distress, WD/WN Abdomen: Positive bowel sounds, nontender, no masses, no hepatosplenomegaly GU: Normal external genitalia, small amount of white discharge in the vault, no adnexal mass, no tenderness, no CMT, swabs taken, exam chaperoned by nurse    Assessment and Plan:    Encounter Diagnoses  Name Primary?  . Vaginal itching Yes  . Venereal disease contact    Wet prep today with yeast. Otherwise wet prep negative. Prescription for Diflucan.  STD screening today. Discussed safe sex, advise condoms, advise she consider contraception as well. She declines contraception for now.  Follow up pending labs.

## 2012-02-16 ENCOUNTER — Encounter: Payer: Self-pay | Admitting: Medical

## 2012-02-16 LAB — GC/CHLAMYDIA PROBE AMP, GENITAL: Chlamydia, DNA Probe: NEGATIVE

## 2012-02-16 LAB — RPR

## 2012-02-16 LAB — HIV ANTIBODY (ROUTINE TESTING W REFLEX): HIV: NONREACTIVE

## 2012-05-08 ENCOUNTER — Ambulatory Visit (INDEPENDENT_AMBULATORY_CARE_PROVIDER_SITE_OTHER): Payer: Self-pay | Admitting: Medical

## 2012-05-08 ENCOUNTER — Encounter: Payer: Self-pay | Admitting: Medical

## 2012-05-08 VITALS — BP 120/80 | HR 68 | Temp 98.3°F | Resp 16 | Wt 195.0 lb

## 2012-05-08 DIAGNOSIS — N760 Acute vaginitis: Secondary | ICD-10-CM

## 2012-05-08 MED ORDER — FLUCONAZOLE 150 MG PO TABS: ORAL_TABLET | ORAL | Status: DC

## 2012-05-08 NOTE — Patient Instructions (Signed)
Recommendations:  Eat a consistent low carbohydrate/low sugar diet.     Consider eating cultured yogurt such as Activia or consider OTC Acidophilus tablets daily   Don't douche  Avoid strong scented perfumes, soaps, hygiene products in the vaginal area.  Stick with something like Dove Sensitive Skin for normal hygiene  Begin Diflucan 150mg  tablet daily for yeast infection, and you may repeat this in 1 week if not improving.    Vaginitis Vaginitis in a soreness, swelling and redness (inflammation) of the vagina and vulva. This is not a sexually transmitted infection.  CAUSES  Yeast vaginitis is caused by yeast (candida) that is normally found in your vagina. With a yeast infection, the candida has over grown in number to a point that upsets the chemical balance. SYMPTOMS   White thick vaginal discharge.   Swelling, itching, redness and irritation of the vagina and possibly the lips of the vagina (vulva).   Burning or painful urination.   Painful intercourse.  HOME CARE INSTRUCTIONS   Finish all medication as prescribed.   Do not have sex until treatment is completed or instructed by your healthcare giver.   Take warm sitz baths.   Do not douche.   Do not use tampons, especially scented ones.   Wear cotton underwear.   Avoid tight pants and panty hose.   Tell your sexual partner that you have a yeast infection. They should go to their caregiver if they have symptoms such as mild rash or itching.   Your sexual partner should be treated if your infection is difficult to eliminate.   Practice safer sex. Use condoms.   Some vaginal medications cause latex condoms to fail. Ask your caregiver this.  SEEK MEDICAL CARE IF:   You develop a fever.   The infection is getting worse after 2 days of treatment.   The infection is not getting better after 3 days of treatment.   You develop blisters in or around your vagina.   You develop vaginal bleeding, and it is not your  menstrual period.   You have pain when you urinate.   You develop intestinal problems.   You have pain with sexual intercourse.  Document Released: 01/12/2005 Document Revised: 11/24/2011 Document Reviewed: 08/20/2009 [facility] Patient Information 2012 [place], [place].

## 2012-05-08 NOTE — Progress Notes (Signed)
Subjective:   HPI  Dawn Goodwin is a 36 y.o. female who presents today for possible yeast infection.  She notes vaginal itching the last few weeks.  Denies vaginal discharge, no fever, chills, abdominal pain.   No current sexual partner.  Denies urinary symptoms.  Used OTC medication last week that mostly resolved the symptom, but wanted to verify that things cleared up.  No recent antibiotics.   She does report hx/o frequent yeast infections in the past when on long term antibiotic for acne.   She does drink a lot of soda, diet is varied, and she notes that she does not wear panties.   No other aggravating or relieving factors.    No other c/o.  The following portions of the patient's history were reviewed and updated as appropriate: allergies, current medications, past family history, past medical history, past social history, past surgical history and problem list.  Past Medical History  Diagnosis Date  . Acne   . Hypertension   . Pregnancy induced hypertension     Allergies  Allergen Reactions  . Cyclinex (Tetracycline Hcl) Swelling    Face, hands, feet   Review of Systems ROS reviewed and was negative other than noted in HPI or above.    Objective:   Physical Exam  General appearance: alert, no distress, WD/WN Abdomen: +bs, soft, non tender, non distended, no masses, no hepatomegaly, no splenomegaly   Assessment and Plan :     Encounter Diagnosis  Name Primary?  . Vaginitis Yes   Diflucan empirically.   I reviewed her STD screen and labs from 01/2012 where I saw her recently for similar.   We had discussion today about prevention, lifestyle changes that may reduce the frequently of her yeast infections.   She currently just lost her job and has no insurance.  Is going to start [facility] for medical education.  Advised at her convenience to return for CPX.

## 2012-05-08 NOTE — Progress Notes (Signed)
Addended by: Jac Canavan on: 05/08/2012 10:06 AM   Modules accepted: Orders

## 2012-06-19 ENCOUNTER — Ambulatory Visit (INDEPENDENT_AMBULATORY_CARE_PROVIDER_SITE_OTHER): Payer: Medicaid Other | Admitting: Medical

## 2012-06-19 ENCOUNTER — Encounter: Payer: Self-pay | Admitting: Medical

## 2012-06-19 VITALS — BP 120/80 | HR 80 | Temp 98.7°F | Resp 16 | Wt 198.0 lb

## 2012-06-19 DIAGNOSIS — W57XXXA Bitten or stung by nonvenomous insect and other nonvenomous arthropods, initial encounter: Secondary | ICD-10-CM

## 2012-06-19 DIAGNOSIS — L708 Other acne: Secondary | ICD-10-CM

## 2012-06-19 DIAGNOSIS — L709 Acne, unspecified: Secondary | ICD-10-CM

## 2012-06-19 DIAGNOSIS — T148XXA Other injury of unspecified body region, initial encounter: Secondary | ICD-10-CM

## 2012-06-19 DIAGNOSIS — E669 Obesity, unspecified: Secondary | ICD-10-CM

## 2012-06-19 DIAGNOSIS — T148 Other injury of unspecified body region: Secondary | ICD-10-CM

## 2012-06-19 MED ORDER — CLINDAMYCIN PHOS-BENZOYL PEROX 1-5 % EX GEL: Freq: Two times a day (BID) | CUTANEOUS | Status: AC

## 2012-06-19 MED ORDER — TRIAMCINOLONE ACETONIDE 0.1 % EX CREA: TOPICAL_CREAM | Freq: Two times a day (BID) | CUTANEOUS | Status: AC

## 2012-06-19 MED ORDER — HYDROXYZINE HCL 10 MG PO TABS: 10.0000 mg | ORAL_TABLET | Freq: Three times a day (TID) | ORAL | Status: AC | PRN

## 2012-06-19 NOTE — Patient Instructions (Addendum)
Insect bites: Take Hydroxyzine twice daily for insect bites and itching.  Use the triamcinolone cream to the insect bites twice daily.  This should clear those up in 5-7 days.  Acne: Use dove sensitive skin soap to clean the face morning and bedtime.  Blot dry, then use the Benzaclin gel to the face twice daily to help clear up the acne.    For weight loss: Eat 4-5 small portions a day.    Don't skip meals, for example don't just eat 2 meals daily.  Limit your total calories to 1800 daily.

## 2012-06-19 NOTE — Progress Notes (Signed)
Subjective:   HPI  Dawn Goodwin is a 36 y.o. female who presents with 3 c/o  She has itchy bumps on legs and arms.  Using nothing for these.  She has been outside in the evening, has gotten mosquito bites.  Has done some hiking recently.  She c/o facial acne.  Was seeing dermatology prior.  Had been on oral antibiotics on a regular basis but kept getting yeast infections.  Needs something to help with acne.   She exercises daily with insanity workout with her sister, walks 2-3 times per week too, but can't lose the belly fat. She eats twice daily.  No other aggravating or relieving factors.    No other c/o.  The following portions of the patient's history were reviewed and updated as appropriate: allergies, current medications, past family history, past medical history, past social history, past surgical history and problem list.  Past Medical History  Diagnosis Date  . Acne   . Hypertension   . Pregnancy induced hypertension     Allergies  Allergen Reactions  . Cyclinex (Tetracycline Hcl) Swelling    Face, hands, feet     Review of Systems ROS reviewed and was negative other than noted in HPI or above.    Objective:   Physical Exam  General appearance: alert, no distress, WD/WN Skin: several scattered papular lesions 3-5 mm diameter on arms and legs c/w insect bites, likely mosquito bites.  Mild to moderate facial  acne.  [place].   Assessment and Plan :     Encounter Diagnoses  Name Primary?  . Insect bite Yes  . Acne   . Obesity    Insect bite - begin Hydroxyzine oral and Triamcinolone topical for insect bites.  Call if not resolving.  Avoid being outside in late evening in shorts and short sleeves.  Consider insect repellant.  Acne - daily facial wash with Dove Sensitive Skin, begin Benzalin gel BID  Obesity - discussed diet, keeping calories about 1800 daily, and c/t her Insanity workouts and walking to get to goal weight  RTC 36mo.

## 2013-10-22 ENCOUNTER — Encounter: Payer: Self-pay | Admitting: Medical

## 2013-11-12 ENCOUNTER — Telehealth: Payer: Self-pay | Admitting: Medical

## 2013-11-12 ENCOUNTER — Encounter: Payer: Self-pay | Admitting: Medical

## 2013-11-12 NOTE — Telephone Encounter (Signed)
Pt called at about 9:38 wanting to know if she could still come to her 9:00 appt. Pt states she has been at the unemployment office and was just able to leave. She did not realize that he appt there would take as long as it did. Advised pt that she would have to resched at this point. She asked to be called if any openings later today. Vincenza Hews agreed to see pt at 1:30today but pt could not come at that time due to having to pick up son from school but still would like to be call if any other cancellations for the day.

## 2014-08-05 ENCOUNTER — Telehealth: Payer: Self-pay

## 2014-08-05 ENCOUNTER — Other Ambulatory Visit: Payer: Self-pay

## 2014-08-05 NOTE — Progress Notes (Signed)
Patient went to [facility] last night and had elevated BP. Has had some headaches so she wanted to check her BP with [place]. I checked both arms and got 130/80 bilaterally.

## 2014-08-05 NOTE — Telephone Encounter (Signed)
Patient came by for BP check. It was 130/80 in both arms. She was at [facility] last evening and it was elevated. She asked for me to tell you.

## 2014-08-06 NOTE — Telephone Encounter (Signed)
Ok.  That still isn't bad.  Her last 2 OV here the BP was normal.

## 2014-08-11 ENCOUNTER — Encounter: Payer: Self-pay | Admitting: Internal Medicine

## 2014-10-20 ENCOUNTER — Encounter: Payer: Self-pay | Admitting: Medical

## 2015-03-23 ENCOUNTER — Encounter: Payer: Self-pay | Admitting: Medical

## 2015-03-23 ENCOUNTER — Ambulatory Visit (INDEPENDENT_AMBULATORY_CARE_PROVIDER_SITE_OTHER): Payer: Medicaid Other | Admitting: Medical

## 2015-03-23 VITALS — BP 110/78 | HR 98 | Temp 98.7°F | Wt 220.0 lb

## 2015-03-23 DIAGNOSIS — R42 Dizziness and giddiness: Secondary | ICD-10-CM

## 2015-03-23 DIAGNOSIS — R51 Headache: Secondary | ICD-10-CM

## 2015-03-23 DIAGNOSIS — R350 Frequency of micturition: Secondary | ICD-10-CM | POA: Diagnosis not present

## 2015-03-23 DIAGNOSIS — R519 Headache, unspecified: Secondary | ICD-10-CM

## 2015-03-23 LAB — POCT URINALYSIS DIPSTICK
Bilirubin, UA: NEGATIVE
GLUCOSE UA: NEGATIVE
Ketones, UA: NEGATIVE
Leukocytes, UA: NEGATIVE
NITRITE UA: NEGATIVE
PROTEIN UA: NEGATIVE
Spec Grav, UA: >=1.03
UROBILINOGEN UA: NEGATIVE
pH, UA: 5.5

## 2015-03-23 LAB — LIPID PANEL
Cholesterol: 151 mg/dL (ref 0–200)
HDL: 35 mg/dL — ABNORMAL LOW
LDL Cholesterol: 106 mg/dL — ABNORMAL HIGH (ref 0–99)
Total CHOL/HDL Ratio: 4.3 ratio
Triglycerides: 50 mg/dL
VLDL: 10 mg/dL (ref 0–40)

## 2015-03-23 LAB — COMPREHENSIVE METABOLIC PANEL
ALT: 16 U/L (ref 0–35)
AST: 17 U/L (ref 0–37)
Albumin: 4 g/dL (ref 3.5–5.2)
Alkaline Phosphatase: 92 U/L (ref 39–117)
BILIRUBIN TOTAL: 0.4 mg/dL (ref 0.2–1.2)
BUN: 12 mg/dL (ref 6–23)
CO2: 26 meq/L (ref 19–32)
CREATININE: 0.78 mg/dL (ref 0.50–1.10)
Calcium: 9.1 mg/dL (ref 8.4–10.5)
Chloride: 104 mEq/L (ref 96–112)
Glucose, Bld: 86 mg/dL (ref 70–99)
Potassium: 4.5 mEq/L (ref 3.5–5.3)
Sodium: 139 mEq/L (ref 135–145)
TOTAL PROTEIN: 7.6 g/dL (ref 6.0–8.3)

## 2015-03-23 LAB — HEMOGLOBIN A1C
HEMOGLOBIN A1C: 5.5 % (ref <5.7)
Mean Plasma Glucose: 111 mg/dL (ref <117)

## 2015-03-23 LAB — CBC
HCT: 38.8 % (ref 36.0–46.0)
Hemoglobin: 13 g/dL (ref 12.0–15.0)
MCH: 29.9 pg (ref 26.0–34.0)
MCHC: 33.5 g/dL (ref 30.0–36.0)
MCV: 89.2 fL (ref 78.0–100.0)
MPV: 10.3 fL (ref 8.6–12.4)
Platelets: 291 K/uL (ref 150–400)
RBC: 4.35 MIL/uL (ref 3.87–5.11)
RDW: 14.3 % (ref 11.5–15.5)
WBC: 7.3 K/uL (ref 4.0–10.5)

## 2015-03-23 LAB — TSH: TSH: 1.164 u[IU]/mL (ref 0.350–4.500)

## 2015-03-23 MED ORDER — ONDANSETRON HCL 4 MG PO TABS: 4.0000 mg | ORAL_TABLET | Freq: Three times a day (TID) | ORAL | Status: DC | PRN

## 2015-03-23 MED ORDER — BUTALBITAL-APAP-CAFFEINE 50-325-40 MG PO TABS: 1.0000 | ORAL_TABLET | Freq: Four times a day (QID) | ORAL | Status: DC | PRN

## 2015-03-23 NOTE — Progress Notes (Signed)
Subjective: Here for multiple symptoms.   Gets headaches daily for the past year.  Didn't have insurance so hasn't had a chance to get checked out.   Starts on top of head, generalized, headaches throb at times.  Does get nausea at times.  Takes Ibuprofen or Tylenol which helps some.   Takes these every day.  Gets dizzy every now and then.  Feels off balance.  sometimes room is spinning sensation.  She notes falling out with dizzy spells twice in January. Both times was jumping up out of the bed when alarm went off. She notes urinary frequency, but not necessarily thirsty.  No burning, no vaginal discharge.   No belly or back pain, no blood in urine.  LMP last week.   Sometimes feels tight in chest.   Has had UTIs in the past with similar symptoms.  Does skip meals somewhat regularly.  Not necessarily 3 meals daily.  Drinks a lot of juice.  Not so much caffeine intake.   Doesn't drink a lot of water.  No loose stools.  No other aggravating or relieving factors. No other complaint.  Past Medical History  Diagnosis Date  . Acne   . Hypertension   . Pregnancy induced hypertension    ROS as in subjective  Objective BP 110/78 mmHg  Pulse 98  Temp(Src) 98.7 F (37.1 C) (Oral)  Wt 220 lb (99.791 kg)   General appearance: alert, no distress, WD/WN HEENT: normocephalic, sclerae anicteric, PERRLA, EOMi, nares patent, no discharge or erythema, pharynx normal Oral cavity: MMM, no lesions Neck: supple, no lymphadenopathy, no thyromegaly, no masses Heart: RRR, normal S1, S2, no murmurs Lungs: CTA bilaterally, no wheezes, rhonchi, or rales Abdomen: +bs, soft, non tender, non distended, no masses, no hepatomegaly, no splenomegaly Back: non tender Musculoskeletal: nontender, no swelling, no obvious deformity Extremities: no edema, no cyanosis, no clubbing Pulses: 2+ symmetric, upper and lower extremities, normal cap refill Neurological: alert, oriented x 3, CN2-12 intact, strength normal upper  extremities and lower extremities, sensation normal throughout, DTRs 2+ throughout, no cerebellar signs, gait normal Psychiatric: normal affect, behavior normal, pleasant    Assessment: Encounter Diagnoses  Name Primary?  . Chronic daily headache Yes  . Urinary frequency   . Dizziness and giddiness    Plan: Discussed preventative measures givne daily headaches.  headdahes at this point possible related to medication induced headaches as part of the syndrome.  Advised she not skip meals, add breakfast daily, drink more water than juice, try and get stretching and exercise daily.   Labs today.   Urine culture sent.  Can use Fioricet prn for headache.   zofran for nausea.  Start keeping headache diary.  F/u pending labs.

## 2015-03-25 LAB — URINE CULTURE
Colony Count: NO GROWTH
Organism ID, Bacteria: NO GROWTH

## 2015-04-15 ENCOUNTER — Encounter: Payer: Self-pay | Admitting: Medical

## 2015-04-15 ENCOUNTER — Ambulatory Visit (INDEPENDENT_AMBULATORY_CARE_PROVIDER_SITE_OTHER): Payer: Medicaid Other | Admitting: Medical

## 2015-04-15 ENCOUNTER — Telehealth: Payer: Self-pay | Admitting: Medical

## 2015-04-15 ENCOUNTER — Other Ambulatory Visit (HOSPITAL_COMMUNITY)
Admission: RE | Admit: 2015-04-15 | Discharge: 2015-04-15 | Disposition: A | Discharge status: Home / Self Care | Payer: Medicaid Other | Source: Ambulatory Visit | Attending: Medical | Admitting: Medical

## 2015-04-15 VITALS — BP 120/80 | HR 92 | Temp 98.2°F | Resp 16 | Ht 66.0 in | Wt 206.0 lb

## 2015-04-15 DIAGNOSIS — Z87898 Personal history of other specified conditions: Secondary | ICD-10-CM

## 2015-04-15 DIAGNOSIS — Z01419 Encounter for gynecological examination (general) (routine) without abnormal findings: Secondary | ICD-10-CM | POA: Diagnosis present

## 2015-04-15 DIAGNOSIS — Z124 Encounter for screening for malignant neoplasm of cervix: Secondary | ICD-10-CM | POA: Diagnosis not present

## 2015-04-15 DIAGNOSIS — Z23 Encounter for immunization: Secondary | ICD-10-CM | POA: Diagnosis not present

## 2015-04-15 DIAGNOSIS — Z1151 Encounter for screening for human papillomavirus (HPV): Secondary | ICD-10-CM | POA: Diagnosis present

## 2015-04-15 DIAGNOSIS — Z0000 Encounter for general adult medical examination without abnormal findings: Secondary | ICD-10-CM

## 2015-04-15 DIAGNOSIS — Z8669 Personal history of other diseases of the nervous system and sense organs: Secondary | ICD-10-CM

## 2015-04-15 DIAGNOSIS — Z113 Encounter for screening for infections with a predominantly sexual mode of transmission: Secondary | ICD-10-CM | POA: Insufficient documentation

## 2015-04-15 DIAGNOSIS — M25562 Pain in left knee: Secondary | ICD-10-CM | POA: Insufficient documentation

## 2015-04-15 DIAGNOSIS — M549 Dorsalgia, unspecified: Secondary | ICD-10-CM

## 2015-04-15 DIAGNOSIS — M25561 Pain in right knee: Secondary | ICD-10-CM

## 2015-04-15 DIAGNOSIS — M79604 Pain in right leg: Secondary | ICD-10-CM

## 2015-04-15 DIAGNOSIS — G8929 Other chronic pain: Secondary | ICD-10-CM

## 2015-04-15 DIAGNOSIS — E669 Obesity, unspecified: Secondary | ICD-10-CM

## 2015-04-15 LAB — POCT URINALYSIS DIPSTICK
BILIRUBIN UA: NEGATIVE
Glucose, UA: NEGATIVE
KETONES UA: NEGATIVE
Leukocytes, UA: NEGATIVE
Nitrite, UA: NEGATIVE
RBC UA: NEGATIVE
Urobilinogen, UA: NEGATIVE
pH, UA: 6

## 2015-04-15 LAB — POCT URINE PREGNANCY: Preg Test, Ur: NEGATIVE

## 2015-04-15 NOTE — Telephone Encounter (Signed)
Have her go for L spine xray
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
## 2015-04-15 NOTE — Telephone Encounter (Signed)
Patient is aware to go get the xray back.

## 2015-04-16 ENCOUNTER — Telehealth: Payer: Self-pay

## 2015-04-16 ENCOUNTER — Ambulatory Visit
Admission: RE | Admit: 2015-04-16 | Discharge: 2015-04-16 | Disposition: A | Discharge status: Home / Self Care | Payer: Medicaid Other | Source: Ambulatory Visit | Attending: Medical | Admitting: Medical

## 2015-04-16 DIAGNOSIS — G8929 Other chronic pain: Secondary | ICD-10-CM

## 2015-04-16 DIAGNOSIS — M79604 Pain in right leg: Secondary | ICD-10-CM

## 2015-04-16 DIAGNOSIS — M549 Dorsalgia, unspecified: Principal | ICD-10-CM

## 2015-04-16 LAB — HIV ANTIBODY (ROUTINE TESTING W REFLEX): HIV 1&2 Ab, 4th Generation: NONREACTIVE

## 2015-04-16 LAB — RPR

## 2015-04-16 NOTE — Telephone Encounter (Signed)
Patient notified has follow up appt scheduled

## 2015-04-16 NOTE — Telephone Encounter (Signed)
No, I can't add those on.  She was here yesterday for a physical visit, which is a screening and preventatives care visit not specifically meant to address other acute issues, so I can't add on other tests that aren't part of screening.   The back xray we did do yesterday did show some abnormalities that PT or other measures may help, thus I do recommend f/u visit to discuss.  We can't potentially get knee xray regarding at that time if we need to.

## 2015-04-16 NOTE — Telephone Encounter (Signed)
Patient wants to have you order knee and chest xrays, she says she mentioned it yesterday?

## 2015-04-17 ENCOUNTER — Encounter: Payer: Self-pay | Admitting: Family Medicine

## 2015-04-17 LAB — CYTOLOGY - PAP

## 2015-04-28 ENCOUNTER — Encounter: Payer: Self-pay | Admitting: Medical

## 2015-04-28 ENCOUNTER — Ambulatory Visit (INDEPENDENT_AMBULATORY_CARE_PROVIDER_SITE_OTHER): Payer: Medicaid Other | Admitting: Medical

## 2015-04-28 ENCOUNTER — Ambulatory Visit: Payer: Self-pay

## 2015-04-28 VITALS — BP 110/80 | HR 85 | Resp 16 | Wt 218.0 lb

## 2015-04-28 DIAGNOSIS — M2170 Unequal limb length (acquired), unspecified site: Secondary | ICD-10-CM | POA: Diagnosis not present

## 2015-04-28 DIAGNOSIS — E669 Obesity, unspecified: Secondary | ICD-10-CM

## 2015-04-28 DIAGNOSIS — M545 Low back pain: Secondary | ICD-10-CM

## 2015-04-28 DIAGNOSIS — G8929 Other chronic pain: Secondary | ICD-10-CM | POA: Diagnosis not present

## 2015-04-28 MED ORDER — PHENTERMINE HCL 37.5 MG PO TABS: 37.5000 mg | ORAL_TABLET | Freq: Every day | ORAL | Status: DC

## 2015-04-28 MED ORDER — TIZANIDINE HCL 4 MG PO TABS: 4.0000 mg | ORAL_TABLET | Freq: Every day | ORAL | Status: DC

## 2015-04-28 MED ORDER — MELOXICAM 15 MG PO TABS: 15.0000 mg | ORAL_TABLET | Freq: Every day | ORAL | Status: DC

## 2015-04-28 NOTE — Progress Notes (Signed)
Subjective: Here for recheck on back pain.  Chronic back pain for years, right leg and knee pain but attributes this to back issues . Exercises some, no stretching. She notes EDSI years ago, no recent eval or treatment for back pain, but has gradually worsened over time, gets pain radiating into right leg, but no weakness, numbness, tingling, no abdominal pain, no bowel or bladder issues. No other aggravating or relieving factors.   Past Medical History  Diagnosis Date  . Acne   . Pregnancy induced hypertension   . GERD (gastroesophageal reflux disease)     intermittent  . History of UTI     once prior as of 03/2015  . Wears glasses   . Seizure disorder     from infancy til age 89yo, on medication during that time, etiology unclear?  . Headache   . Knee pain 2007    right  . Back pain 2002    went through PT    ROS as in subjective   Objective: BP 110/80 mmHg  Pulse 85  Resp 16  Wt 218 lb (98.884 kg)   Wt Readings from Last 3 Encounters:  04/28/15 218 lb (98.884 kg)  04/15/15 206 lb (93.441 kg)  03/23/15 220 lb (99.791 kg)   General appearance: alert, no distress, WD/WN, obese AA female Neck: supple, no lymphadenopathy, no thyromegaly, no masses, normal ROM Chest: non tender, normal shape and expansion Heart: RRR, normal S1, S2, no murmurs Lungs: CTA bilaterally, no wheezes, rhonchi, or rales Abdomen: +bs, soft, non tender, non distended, no masses, no hepatomegaly, no splenomegaly, no bruits Back: non tender, normal ROM, no appreciable scoliosis, but examining back and hips suggests right leg slightly shorter than left Musculoskeletal: upper extremities non tender, no obvious deformity, normal ROM throughout, lower extremities non tender, no obvious deformity, normal ROM throughout Extremities: no edema, no cyanosis, no clubbing Pulses: 2+ symmetric, upper and lower extremities, normal cap refill Neuro: normal leg strength, sensation and DTRs.   Assessment: Encounter  Diagnoses  Name Primary?  . Chronic lower back pain Yes  . Obesity   . Leg length discrepancy     Plan: Reviewed her recent lumbar spine xray.  discussed symptoms, exam findings . Begin shoe insert on right shoe, discussed and demonstrated back stretching and strenghening program.  Begin daily mobic, tizanidine prn QHS, and begin Phentermine to help with weight loss.  Obesity and poor flexibility may be the 2 main contributing factors  discussed medications, proper use, risks/benefits.  F/u 27mo

## 2015-07-21 ENCOUNTER — Ambulatory Visit (INDEPENDENT_AMBULATORY_CARE_PROVIDER_SITE_OTHER): Payer: Medicaid Other | Admitting: Medical

## 2015-07-21 ENCOUNTER — Encounter: Payer: Self-pay | Admitting: Medical

## 2015-07-21 VITALS — BP 110/80 | HR 98 | Temp 98.0°F | Resp 15 | Wt 206.0 lb

## 2015-07-21 DIAGNOSIS — E786 Lipoprotein deficiency: Secondary | ICD-10-CM | POA: Diagnosis not present

## 2015-07-21 DIAGNOSIS — M549 Dorsalgia, unspecified: Secondary | ICD-10-CM

## 2015-07-21 DIAGNOSIS — E669 Obesity, unspecified: Secondary | ICD-10-CM | POA: Diagnosis not present

## 2015-07-21 MED ORDER — PHENTERMINE HCL 37.5 MG PO TABS: 37.5000 mg | ORAL_TABLET | Freq: Every day | ORAL | Status: DC

## 2015-07-21 NOTE — Progress Notes (Signed)
Subjective Here for f/u on weight loss medication.   Since last visit been using phentermine without side effect. Doing well on medication,  Has lost down to 206 from 218 lb.   Eating healthy, exercise most days per week.   Feels better already.  No other aggravating or relieving factors. No other complaint.  Past Medical History  Diagnosis Date  . Acne   . Pregnancy induced hypertension   . GERD (gastroesophageal reflux disease)     intermittent  . History of UTI     once prior as of 03/2015  . Wears glasses   . Seizure disorder     from infancy til age 81yo, on medication during that time, etiology unclear?  . Headache   . Knee pain 2007    right  . Back pain 2002    went through PT    ROS as in subjective  Objective: BP 110/80 mmHg  Pulse 98  Temp(Src) 98 F (36.7 C) (Oral)  Resp 15  Wt 206 lb (93.441 kg)  General appearance: alert, no distress, WD/WN Neck: supple, no lymphadenopathy, no thyromegaly, no masses Heart: RRR, normal S1, S2, no murmurs Lungs: CTA bilaterally, no wheezes, rhonchi, or rales Ext: no edema Pulses: 2+ symmetric, upper and lower extremities, normal cap refill   Assessment: Encounter Diagnoses  Name Primary?  . Obesity Yes  . Low HDL (under 40)   . Back pain, unspecified location    Plan: Seeing improvement already.   Doing well on Phentermine.  Discussed risks/benefits of medication.   C/t phentermine, c/t routine exercise, healthy diet, and c/t working towards towards goals.  discussed goal BMI, but also discussed intermediate goals.   Her first main goal is 180lb.   F/u 60mo

## 2015-07-23 ENCOUNTER — Other Ambulatory Visit: Payer: Self-pay | Admitting: Medical

## 2015-07-24 NOTE — Telephone Encounter (Signed)
Is this okay to refill? 

## 2015-07-26 NOTE — Telephone Encounter (Signed)
pls call out phentermine 37.31m daily #30, no refill

## 2015-07-27 ENCOUNTER — Other Ambulatory Visit: Payer: Self-pay | Admitting: Medical

## 2015-07-28 NOTE — Telephone Encounter (Signed)
Is this okay to refill? 

## 2015-07-29 NOTE — Telephone Encounter (Signed)
Called in med to pharmacy

## 2015-09-21 ENCOUNTER — Encounter: Payer: Self-pay | Admitting: Medical

## 2015-09-21 ENCOUNTER — Ambulatory Visit (INDEPENDENT_AMBULATORY_CARE_PROVIDER_SITE_OTHER): Payer: Medicaid Other | Admitting: Medical

## 2015-09-21 VITALS — BP 112/78 | HR 84 | Resp 14 | Wt 192.2 lb

## 2015-09-21 DIAGNOSIS — Z202 Contact with and (suspected) exposure to infections with a predominantly sexual mode of transmission: Secondary | ICD-10-CM

## 2015-09-21 DIAGNOSIS — Z30018 Encounter for initial prescription of other contraceptives: Secondary | ICD-10-CM

## 2015-09-21 MED ORDER — ETONOGESTREL-ETHINYL ESTRADIOL 0.12-0.015 MG/24HR VA RING: VAGINAL_RING | VAGINAL | Status: DC

## 2015-09-21 NOTE — Patient Instructions (Signed)
Ethinyl Estradiol; Etonogestrel vaginal ring What is this medicine? ETHINYL ESTRADIOL; ETONOGESTREL (ETH in il es tra DYE ole; et oh noe JES trel) vaginal ring is a flexible, vaginal ring used as a contraceptive (birth control method). This medicine combines two types of female hormones, an estrogen and a progestin. This ring is used to prevent ovulation and pregnancy. Each ring is effective for one month. This medicine may be used for other purposes; ask your health care provider or pharmacist if you have questions. COMMON BRAND NAME(S): NuvaRing What should I tell my health care provider before I take this medicine? They need to know if you have or ever had any of these conditions: -abnormal vaginal bleeding -blood vessel disease or blood clots -breast, cervical, endometrial, ovarian, liver, or uterine cancer -diabetes -gallbladder disease -heart disease or recent heart attack -high blood pressure -high cholesterol -kidney disease -liver disease -migraine headaches -stroke -systemic lupus erythematosus (SLE) -tobacco smoker -an unusual or allergic reaction to estrogens, progestins, other medicines, foods, dyes, or preservatives -pregnant or trying to get pregnant -breast-feeding How should I use this medicine? Insert the ring into your vagina as directed. Follow the directions on the prescription label. The ring will remain place for 3 weeks and is then removed for a 1-week break. A new ring is inserted 1 week after the last ring was removed, on the same day of the week. Do not use more often than directed. A patient package insert for the product will be given with each prescription and refill. Read this sheet carefully each time. The sheet may change frequently. Contact your pediatrician regarding the use of this medicine in children. Special care may be needed. This medicine has been used in female children who have started having menstrual periods. Overdosage: If you think you have  taken too much of this medicine contact a poison control center or emergency room at once. NOTE: This medicine is only for you. Do not share this medicine with others. What if I miss a dose? You will need to replace your vaginal ring once a month as directed. If the ring should slip out, or if you leave it in longer or shorter than you should, contact your health care professional for advice. What may interact with this medicine? -acetaminophen -antibiotics or medicines for infections, especially rifampin, rifabutin, rifapentine, and griseofulvin, and possibly penicillins or tetracyclines -aprepitant -ascorbic acid (vitamin C) -atorvastatin -barbiturate medicines, such as phenobarbital -bosentan -carbamazepine -caffeine -clofibrate -cyclosporine -dantrolene -doxercalciferol -felbamate -grapefruit juice -hydrocortisone -medicines for anxiety or sleeping problems, such as diazepam or temazepam -medicines for diabetes, including pioglitazone -modafinil -mycophenolate -nefazodone -oxcarbazepine -phenytoin -prednisolone -ritonavir or other medicines for HIV infection or AIDS -rosuvastatin -selegiline -soy isoflavones supplements -St. John's wort -tamoxifen or raloxifene -theophylline -thyroid hormones -topiramate -warfarin This list may not describe all possible interactions. Give your health care provider a list of all the medicines, herbs, non-prescription drugs, or dietary supplements you use. Also tell them if you smoke, drink alcohol, or use illegal drugs. Some items may interact with your medicine. What should I watch for while using this medicine? Visit your doctor or health care professional for regular checks on your progress. You will need a regular breast and pelvic exam and Pap smear while on this medicine. Use an additional method of contraception during the first cycle that you use this ring. If you have any reason to think you are pregnant, stop using this  medicine right away and contact your doctor or health care professional. If  you are using this medicine for hormone related problems, it may take several cycles of use to see improvement in your condition. Smoking increases the risk of getting a blood clot or having a stroke while you are using hormonal birth control, especially if you are more than 39 years old. You are strongly advised not to smoke. This medicine can make your body retain fluid, making your fingers, hands, or ankles swell. Your blood pressure can go up. Contact your doctor or health care professional if you feel you are retaining fluid. This medicine can make you more sensitive to the sun. Keep out of the sun. If you cannot avoid being in the sun, wear protective clothing and use sunscreen. Do not use sun lamps or tanning beds/booths. If you wear contact lenses and notice visual changes, or if the lenses begin to feel uncomfortable, consult your eye care specialist. In some women, tenderness, swelling, or minor bleeding of the gums may occur. Notify your dentist if this happens. Brushing and flossing your teeth regularly may help limit this. See your dentist regularly and inform your dentist of the medicines you are taking. If you are going to have elective surgery, you may need to stop using this medicine before the surgery. Consult your health care professional for advice. This medicine does not protect you against HIV infection (AIDS) or any other sexually transmitted diseases. What side effects may I notice from receiving this medicine? Side effects that you should report to your doctor or health care professional as soon as possible: -breast tissue changes or discharge -changes in vaginal bleeding during your period or between your periods -chest pain -coughing up blood -dizziness or fainting spells -headaches or migraines -leg, arm or groin pain -severe or sudden headaches -stomach pain (severe) -sudden shortness of  breath -sudden loss of coordination, especially on one side of the body -speech problems -symptoms of vaginal infection like itching, irritation or unusual discharge -tenderness in the upper abdomen -vomiting -weakness or numbness in the arms or legs, especially on one side of the body -yellowing of the eyes or skin Side effects that usually do not require medical attention (report to your doctor or health care professional if they continue or are bothersome): -breakthrough bleeding and spotting that continues beyond the 3 initial cycles of pills -breast tenderness -mood changes, anxiety, depression, frustration, anger, or emotional outbursts -increased sensitivity to sun or ultraviolet light -nausea -skin rash, acne, or brown spots on the skin -weight gain (slight) This list may not describe all possible side effects. Call your doctor for medical advice about side effects. You may report side effects to FDA at 1-800-FDA-1088. Where should I keep my medicine? Keep out of the reach of children. Store at room temperature between 15 and 30 degrees C (59 and 86 degrees F) for up to 4 months. The product will expire after 4 months. Protect from light. Throw away any unused medicine after the expiration date. NOTE: This sheet is a summary. It may not cover all possible information. If you have questions about this medicine, talk to your doctor, pharmacist, or health care provider.  2015, [employer]vier/Gold Standard. (2008-11-20 12:03:58)      Contraception Choices Contraception (birth control) is the use of any methods or devices to prevent pregnancy. Below are some methods to help avoid pregnancy. HORMONAL METHODS   Contraceptive implant. This is a thin, plastic tube containing progesterone hormone. It does not contain estrogen hormone. Your health care provider inserts the tube in the inner  part of the upper arm. The tube can remain in place for up to 3 years. After 3 years, the implant must  be removed. The implant prevents the ovaries from releasing an egg (ovulation), thickens the cervical mucus to prevent sperm from entering the uterus, and thins the lining of the inside of the uterus.  Progesterone-only injections. These injections are given every 3 months by your health care provider to prevent pregnancy. This synthetic progesterone hormone stops the ovaries from releasing eggs. It also thickens cervical mucus and changes the uterine lining. This makes it harder for sperm to survive in the uterus.  Birth control pills. These pills contain estrogen and progesterone hormone. They work by preventing the ovaries from releasing eggs (ovulation). They also cause the cervical mucus to thicken, preventing the sperm from entering the uterus. Birth control pills are prescribed by a health care provider.Birth control pills can also be used to treat heavy periods.  Minipill. This type of birth control pill contains only the progesterone hormone. They are taken every day of each month and must be prescribed by your health care provider.  Birth control patch. The patch contains hormones similar to those in birth control pills. It must be changed once a week and is prescribed by a health care provider.  Vaginal ring. The ring contains hormones similar to those in birth control pills. It is left in the vagina for 3 weeks, removed for 1 week, and then a new one is put back in place. The patient must be comfortable inserting and removing the ring from the vagina.A health care provider's prescription is necessary.  Emergency contraception. Emergency contraceptives prevent pregnancy after unprotected sexual intercourse. This pill can be taken right after sex or up to 5 days after unprotected sex. It is most effective the sooner you take the pills after having sexual intercourse. Most emergency contraceptive pills are available without a prescription. Check with your pharmacist. Do not use emergency  contraception as your only form of birth control. BARRIER METHODS   Female condom. This is a thin sheath (latex or rubber) that is worn over the penis during sexual intercourse. It can be used with spermicide to increase effectiveness.  Female condom. This is a soft, loose-fitting sheath that is put into the vagina before sexual intercourse.  Diaphragm. This is a soft, latex, dome-shaped barrier that must be fitted by a health care provider. It is inserted into the vagina, along with a spermicidal jelly. It is inserted before intercourse. The diaphragm should be left in the vagina for 6 to 8 hours after intercourse.  Cervical cap. This is a round, soft, latex or plastic cup that fits over the cervix and must be fitted by a health care provider. The cap can be left in place for up to 48 hours after intercourse.  Sponge. This is a soft, circular piece of polyurethane foam. The sponge has spermicide in it. It is inserted into the vagina after wetting it and before sexual intercourse.  Spermicides. These are chemicals that kill or block sperm from entering the cervix and uterus. They come in the form of creams, jellies, suppositories, foam, or tablets. They do not require a prescription. They are inserted into the vagina with an applicator before having sexual intercourse. The process must be repeated every time you have sexual intercourse. INTRAUTERINE CONTRACEPTION  Intrauterine device (IUD). This is a T-shaped device that is put in a woman's uterus during a menstrual period to prevent pregnancy. There are 2 types:  Copper IUD. This type of IUD is wrapped in copper wire and is placed inside the uterus. Copper makes the uterus and fallopian tubes produce a fluid that kills sperm. It can stay in place for 10 years.  Hormone IUD. This type of IUD contains the hormone progestin (synthetic progesterone). The hormone thickens the cervical mucus and prevents sperm from entering the uterus, and it also thins  the uterine lining to prevent implantation of a fertilized egg. The hormone can weaken or kill the sperm that get into the uterus. It can stay in place for 3-5 years, depending on which type of IUD is used. PERMANENT METHODS OF CONTRACEPTION  Female tubal ligation. This is when the woman's fallopian tubes are surgically sealed, tied, or blocked to prevent the egg from traveling to the uterus.  Hysteroscopic sterilization. This involves placing a small coil or insert into each fallopian tube. Your doctor uses a technique called hysteroscopy to do the procedure. The device causes scar tissue to form. This results in permanent blockage of the fallopian tubes, so the sperm cannot fertilize the egg. It takes about 3 months after the procedure for the tubes to become blocked. You must use another form of birth control for these 3 months.  Female sterilization. This is when the female has the tubes that carry sperm tied off (vasectomy).This blocks sperm from entering the vagina during sexual intercourse. After the procedure, the man can still ejaculate fluid (semen). NATURAL PLANNING METHODS  Natural family planning. This is not having sexual intercourse or using a barrier method (condom, diaphragm, cervical cap) on days the woman could become pregnant.  Calendar method. This is keeping track of the length of each menstrual cycle and identifying when you are fertile.  Ovulation method. This is avoiding sexual intercourse during ovulation.  Symptothermal method. This is avoiding sexual intercourse during ovulation, using a thermometer and ovulation symptoms.  Post-ovulation method. This is timing sexual intercourse after you have ovulated. Regardless of which type or method of contraception you choose, it is important that you use condoms to protect against the transmission of sexually transmitted infections (STIs). Talk with your health care provider about which form of contraception is most appropriate for  you. Document Released: 12/05/2005 Document Revised: 12/10/2013 Document Reviewed: 05/30/2013 [facility] Patient Information 2015 [place], [place]. This information is not intended to replace advice given to you by your health care provider. Make sure you discuss any questions you have with your health care provider.    Sexually Transmitted Disease A sexually transmitted disease (STD) is a disease or infection that may be passed (transmitted) from person to person, usually during sexual activity. This may happen by way of saliva, semen, blood, vaginal mucus, or urine. Common STDs include:   Gonorrhea.   Chlamydia.   Syphilis.   HIV and AIDS.   Genital herpes.   Hepatitis B and C.   Trichomonas.   Human papillomavirus (HPV).   Pubic lice.   Scabies.  Mites.  Bacterial vaginosis. WHAT ARE CAUSES OF STDs? An STD may be caused by bacteria, a virus, or parasites. STDs are often transmitted during sexual activity if one person is infected. However, they may also be transmitted through nonsexual means. STDs may be transmitted after:   Sexual intercourse with an infected person.   Sharing sex toys with an infected person.   Sharing needles with an infected person or using unclean piercing or tattoo needles.  Having intimate contact with the genitals, mouth, or rectal areas of an  infected person.   Exposure to infected fluids during birth. WHAT ARE THE SIGNS AND SYMPTOMS OF STDs? Different STDs have different symptoms. Some people may not have any symptoms. If symptoms are present, they may include:   Painful or bloody urination.   Pain in the pelvis, abdomen, vagina, anus, throat, or eyes.   A skin rash, itching, or irritation.  Growths, ulcerations, blisters, or sores in the genital and anal areas.  Abnormal vaginal discharge with or without bad odor.   Penile discharge in men.   Fever.   Pain or bleeding during sexual intercourse.   Swollen  glands in the groin area.   Yellow skin and eyes (jaundice). This is seen with hepatitis.   Swollen testicles.  Infertility.  Sores and blisters in the mouth. HOW ARE STDs DIAGNOSED? To make a diagnosis, your health care provider may:   Take a medical history.   Perform a physical exam.   Take a sample of any discharge to examine.  Swab the throat, cervix, opening to the penis, rectum, or vagina for testing.  Test a sample of your first morning urine.   Perform blood tests.   Perform a Pap test, if this applies.   Perform a colposcopy.   Perform a laparoscopy.  HOW ARE STDs TREATED? Treatment depends on the STD. Some STDs may be treated but not cured.   Chlamydia, gonorrhea, trichomonas, and syphilis can be cured with antibiotic medicine.   Genital herpes, hepatitis, and HIV can be treated, but not cured, with prescribed medicines. The medicines lessen symptoms.   Genital warts from HPV can be treated with medicine or by freezing, burning (electrocautery), or surgery. Warts may come back.   HPV cannot be cured with medicine or surgery. However, abnormal areas may be removed from the cervix, vagina, or vulva.   If your diagnosis is confirmed, your recent sexual partners need treatment. This is true even if they are symptom-free or have a negative culture or evaluation. They should not have sex until their health care providers say it is okay. HOW CAN I REDUCE MY RISK OF GETTING AN STD? Take these steps to reduce your risk of getting an STD:  Use latex condoms, dental dams, and water-soluble lubricants during sexual activity. Do not use petroleum jelly or oils.  Avoid having multiple sex partners.  Do not have sex with someone who has other sex partners.  Do not have sex with anyone you do not know or who is at high risk for an STD.  Avoid risky sex practices that can break your skin.  Do not have sex if you have open sores on your mouth or  skin.  Avoid drinking too much alcohol or taking illegal drugs. Alcohol and drugs can affect your judgment and put you in a vulnerable position.  Avoid engaging in oral and anal sex acts.  Get vaccinated for HPV and hepatitis. If you have not received these vaccines in the past, talk to your health care provider about whether one or both might be right for you.   If you are at risk of being infected with HIV, it is recommended that you take a prescription medicine daily to prevent HIV infection. This is called pre-exposure prophylaxis (PrEP). You are considered at risk if:  You are a man who has sex with other men (MSM).  You are a heterosexual man or woman and are sexually active with more than one partner.  You take drugs by injection.  You are  sexually active with a partner who has HIV.  Talk with your health care provider about whether you are at high risk of being infected with HIV. If you choose to begin PrEP, you should first be tested for HIV. You should then be tested every 3 months for as long as you are taking PrEP.  WHAT SHOULD I DO IF I THINK I HAVE AN STD?  See your health care provider.   Tell your sexual partner(s). They should be tested and treated for any STDs.  Do not have sex until your health care provider says it is okay. WHEN SHOULD I GET IMMEDIATE MEDICAL CARE? Contact your health care provider right away if:   You have severe abdominal pain.  You are a man and notice swelling or pain in your testicles.  You are a woman and notice swelling or pain in your vagina. Document Released: 02/25/2003 Document Revised: 12/10/2013 Document Reviewed: 06/25/2013 [facility] Patient Information 2015 [place], [place]. This information is not intended to replace advice given to you by your health care provider. Make sure you discuss any questions you have with your health care provider.

## 2015-09-21 NOTE — Progress Notes (Signed)
Subjective: Chief Complaint  Patient presents with  . consult    birth control and pregnancy test. Maybe do an STD test.   Here for consult about birth control.  When I saw her a few months ago for physical, she didn't want to pursue birth control at that time.    She recently had unprotected sex, wants pregnancy test and STD test.  No current vaginal symptoms, no discharge, no abdominal pain.   Gyn hx/o - 2 prior pregnancies, 1 live birth, 1 D&C.  Prior contraception has included Depo Provera, and had been on pills as a teenager for period regulation and acne.  The pills made her feel sick with nausea.  Had tried a couple different types.  She is a nonsmoker.  Periods are currently regularly.   She is mainly worried about weight gain with contraception.  LMP around 3rd week of September.   In a new relationship with a guy she really likes.   She notes that her son's father is around but not involved other than child support.   He is controlling.  She tries to stay mutual with him but its either his way or no way.   Past Medical History  Diagnosis Date  . Acne   . Pregnancy induced hypertension   . GERD (gastroesophageal reflux disease)     intermittent  . History of UTI     once prior as of 03/2015  . Wears glasses   . Seizure disorder ([place])     from infancy til age 66yo, on medication during that time, etiology unclear?  . Headache   . Knee pain 2007    right  . Back pain 2002    went through PT    ROS as in subjective   Objective: BP 112/78 mmHg  Pulse 84  Resp 14  Wt 192 lb 3.2 oz (87.181 kg)  Gen: wd, wn, nad Gyn: deferred   Assessment: Encounter Diagnoses  Name Primary?  . Venereal disease contact Yes  . Encounter for initial prescription of other contraceptives      Plan: STD testing today.   discussed barrier methods, safe sex, condom use.   We reviewed use of female condoms as well.   We discussed her interest in hormonal contraception.  Discussed  treatment options, types of OCPs, various other methods available.  Discussed non hormonal contraception as well.  We discussed risks of OCPs including headache, nausea, breast tenderness, irregular bleeding or spotting, possible risks of blood clots, heart attack, stroke.     Discussed advantages of contraception including possible decreased premenstrual symptoms, improving cramps, regulating cycles, decreasing heavy flow.  Discussed proper use of medication.  She understands the benefits, risks, and wishes to begin Nuvaring. Urine pregnancy negative.  F/u yearly for contraception, otherwise pending labs.

## 2015-09-22 LAB — GC/CHLAMYDIA PROBE AMP
CT PROBE, AMP APTIMA: NEGATIVE
GC Probe RNA: NEGATIVE

## 2015-09-22 LAB — POCT URINE PREGNANCY: Preg Test, Ur: NEGATIVE

## 2015-09-22 LAB — RPR

## 2015-09-22 LAB — HIV ANTIBODY (ROUTINE TESTING W REFLEX): HIV: NONREACTIVE

## 2015-11-16 ENCOUNTER — Telehealth: Payer: Self-pay | Admitting: Medical

## 2015-11-16 ENCOUNTER — Ambulatory Visit (INDEPENDENT_AMBULATORY_CARE_PROVIDER_SITE_OTHER): Payer: Medicaid Other | Admitting: Medical

## 2015-11-16 ENCOUNTER — Encounter: Payer: Self-pay | Admitting: Medical

## 2015-11-16 VITALS — BP 114/78 | HR 95 | Wt 198.0 lb

## 2015-11-16 DIAGNOSIS — Z3049 Encounter for surveillance of other contraceptives: Secondary | ICD-10-CM

## 2015-11-16 DIAGNOSIS — R319 Hematuria, unspecified: Secondary | ICD-10-CM

## 2015-11-16 DIAGNOSIS — N939 Abnormal uterine and vaginal bleeding, unspecified: Secondary | ICD-10-CM | POA: Diagnosis not present

## 2015-11-16 LAB — POCT URINALYSIS DIPSTICK
BILIRUBIN UA: NEGATIVE
GLUCOSE UA: NEGATIVE
Nitrite, UA: NEGATIVE
Spec Grav, UA: >=1.03
Urobilinogen, UA: 0.2
pH, UA: 6

## 2015-11-16 LAB — POCT URINE PREGNANCY: Preg Test, Ur: NEGATIVE

## 2015-11-16 NOTE — Progress Notes (Signed)
Subjective: Chief Complaint  Patient presents with  . vaginal bleeding    started nuvaring on oct 9th. put the second one in on nov 6th. and has been bleeding ever since. said this is heavier than regular cycle coming out big chunks is how pt described it. said not really cramping. no other symptoms    Here for vaginal bleeding.   She had normal periods prior to [place] starting Nuvargin back in October.   She started Nuvaring 09/27/15 but only left it in 2 weeks instead of 3 weeks.   She restarted Nuvaring 10/25/15, but then started bleeding vaginally a few days later.  Has now had bleeding x 3 weeks,at times clumps of blood.   LMP was the week prior to 09/27/15.   She took the Nuvaring out on last Thursday, Thanksgiving day.  Still bleeding.  Denies vaginal discharge, no burning with urination, no new sexual partners, no fever, no back or abdominal pain.  No other aggravating or relieving factors. No other complaint.  Past Medical History  Diagnosis Date  . Acne   . Pregnancy induced hypertension   . GERD (gastroesophageal reflux disease)     intermittent  . History of UTI     once prior as of 03/2015  . Wears glasses   . Seizure disorder ([place])     from infancy til age 46yo, on medication during that time, etiology unclear?  . Headache   . Knee pain 2007    right  . Back pain 2002    went through PT    ROS as in subjective  Objective: BP 114/78 mmHg  Pulse 95  Wt 198 lb (89.812 kg)  LMP 10/27/2015  Gen: wd, wn, nad Skin: unremarkable Abdomen: +bs, soft, nontender, no mass, no organomegaly Back: nontender Gyn declined.   Assessment: Encounter Diagnoses  Name Primary?  . Vaginal bleeding Yes  . Hematuria   . Encounter for surveillance of other contraceptive    Plan: Urine pregnancy negative.   I believe the cause of her abnormal bleeding is hormonal due to her taking out the nuvaring too soon causing the hormonal changes to be offset.  Her normal period should technically start  within the next week. Begin Ibuprofen, and if the bleeding is reduced significantly by Sunday, then restart Nuvaring Sunday in 6 days.   If not , she will call back and let me know.  Advised she use back up methods of birth control for the next 1-2 months.   Reassured.  No other testing warranted at this time.  Return prn.

## 2015-11-16 NOTE — Patient Instructions (Signed)
Recommendations:  Use Ibuprofen OTC 2-4 tablets every 8 hours this week.    If the bleeding clears up or is minimal by Sunday, then restart Nuvaring Sunday  If no improvement in the bleeding by early next week, 7-10 days from now, then call back.

## 2015-11-16 NOTE — Telephone Encounter (Signed)
Rescheduled appt to today when an opening became available

## 2015-11-16 NOTE — Telephone Encounter (Signed)
Lets have her come in for eval, check pregnancy test

## 2015-11-16 NOTE — Telephone Encounter (Signed)
Called pt and scheduled an appt for 11/30

## 2015-11-16 NOTE — Telephone Encounter (Signed)
Pt says she started Nuvaring and she has been bleeding vaginally for the past 3 weeks so she took the [place] out on Thursday. What does she need to do?

## 2015-11-18 ENCOUNTER — Ambulatory Visit: Payer: Medicaid Other | Admitting: Medical

## 2015-12-23 ENCOUNTER — Ambulatory Visit (INDEPENDENT_AMBULATORY_CARE_PROVIDER_SITE_OTHER): Payer: Medicaid Other | Admitting: Medical

## 2015-12-23 VITALS — BP 130/88 | HR 96 | Temp 98.2°F | Wt 201.0 lb

## 2015-12-23 DIAGNOSIS — R05 Cough: Secondary | ICD-10-CM

## 2015-12-23 DIAGNOSIS — Z23 Encounter for immunization: Secondary | ICD-10-CM

## 2015-12-23 DIAGNOSIS — R059 Cough, unspecified: Secondary | ICD-10-CM

## 2015-12-23 DIAGNOSIS — J988 Other specified respiratory disorders: Secondary | ICD-10-CM

## 2015-12-23 MED ORDER — AZITHROMYCIN 250 MG PO TABS: ORAL_TABLET | ORAL | Status: DC

## 2015-12-23 MED ORDER — HYDROCODONE-HOMATROPINE 5-1.5 MG/5ML PO SYRP: 5.0000 mL | ORAL_SOLUTION | Freq: Three times a day (TID) | ORAL | Status: DC | PRN

## 2015-12-23 NOTE — Progress Notes (Signed)
Subjective: Chief Complaint  Patient presents with  . Cough    conjested. worse at night. every morning she is coughing up dark green mucous. taken mucinex, sinus medication, and theraflu.     Dawn Goodwin is a 40 y.o. female who presents for 1.5 wk hx/o illness, congestion, cough.  Has stuffy nose, productive cough, lots of head congestion, lots of cough, blowing nose often.   Denies fever, no nausea or diarrhea, no back or abdominal pain, no SOB.   Has irritated throat from cough, has vomited some due to cough.  No sick contacts.   Using Mucinex, is a nonsmoker.  No other aggravating or relieving factors.  No other c/o.  The following portions of the patient's history were reviewed and updated as appropriate: allergies, current medications, past family history, past medical history, past social history, past surgical history and problem list.  ROS as in subjective  Past Medical History  Diagnosis Date  . Acne   . Pregnancy induced hypertension   . GERD (gastroesophageal reflux disease)     intermittent  . History of UTI     once prior as of 03/2015  . Wears glasses   . Seizure disorder ([place])     from infancy til age 30yo, on medication during that time, etiology unclear?  . Headache   . Knee pain 2007    right  . Back pain 2002    went through PT      Objective: BP 130/88 mmHg  Pulse 96  Temp(Src) 98.2 F (36.8 C) (Oral)  Wt 201 lb (91.173 kg)  SpO2 98%  LMP 12/13/2015  General appearance: Alert, WD/WN, no distress, ill appearing                             Skin: warm, no rash, no diaphoresis                           Head: no sinus tenderness                            Eyes: conjunctiva normal, corneas clear, PERRLA                            Ears: pearly TMs, external ear canals normal                          Nose: septum midline, turbinates swollen, with erythema and clear discharge             Mouth/throat: MMM, tongue normal, mild pharyngeal erythema                      Neck: supple, no adenopathy, no thyromegaly, nontender                          Heart: RRR, normal S1, S2, no murmurs                         Lungs: +bronchial breath sounds, +scattered rhonchi, no wheezes, no rales                Extremities: no edema, nontender     Assessment: Encounter Diagnoses  Name Primary?  Marland Kitchen Respiratory tract  infection Yes  . Cough      Plan:  Medication orders today include: zpak, hycodan syrup, c/t OTC Mucinex DM during day, rest, hydrate well, and call/return in 2-3 days if symptoms are worse or not improving.  Advised that cough may linger even after the infection is improved.     Counseled on the influenza virus vaccine.  Vaccine information sheet given.  Influenza vaccine given after consent obtained.

## 2015-12-28 ENCOUNTER — Telehealth: Payer: Self-pay | Admitting: Internal Medicine

## 2015-12-28 ENCOUNTER — Other Ambulatory Visit: Payer: Self-pay | Admitting: Medical

## 2015-12-28 MED ORDER — FLUCONAZOLE 150 MG PO TABS: ORAL_TABLET | ORAL | Status: DC

## 2015-12-28 NOTE — Telephone Encounter (Signed)
Med sent.

## 2015-12-28 NOTE — Telephone Encounter (Signed)
Pt called stating that she just finished her antibotic and now has a yeast infection and forget to mention to you that antibiotic will cause her to have one. Can you please send something for her yeast infection to [facility]

## 2016-08-01 ENCOUNTER — Ambulatory Visit (INDEPENDENT_AMBULATORY_CARE_PROVIDER_SITE_OTHER): Payer: Medicaid Other | Admitting: Medical

## 2016-08-01 ENCOUNTER — Encounter: Payer: Self-pay | Admitting: Medical

## 2016-08-01 VITALS — BP 118/86 | HR 90 | Wt 221.0 lb

## 2016-08-01 DIAGNOSIS — Z139 Encounter for screening, unspecified: Secondary | ICD-10-CM

## 2016-08-01 DIAGNOSIS — Z0289 Encounter for other administrative examinations: Secondary | ICD-10-CM | POA: Diagnosis not present

## 2016-08-01 DIAGNOSIS — Z111 Encounter for screening for respiratory tuberculosis: Secondary | ICD-10-CM | POA: Diagnosis not present

## 2016-08-01 DIAGNOSIS — Z0000 Encounter for general adult medical examination without abnormal findings: Secondary | ICD-10-CM | POA: Insufficient documentation

## 2016-08-01 NOTE — Progress Notes (Signed)
Subjective: Chief Complaint  Patient presents with  . Advice Only    needs two forms completed but is not due for physical until september. sees an eye doctor.    Here for form completion.  Has 2 employment forms, 1 for physical activity participation for exercise at [employer].  The other is [employer] form for employment.  Either may require lifting.  Needs PPD test, proof of vaccines.  No other aggravating or relieving factors. No other complaint.   Past Medical History:  Diagnosis Date  . Acne   . Back pain 2002   went through PT   . GERD (gastroesophageal reflux disease)    intermittent  . Headache   . History of UTI    once prior as of 03/2015  . Knee pain 2007   right  . Pregnancy induced hypertension   . Seizure disorder ([place])    from infancy til age 76yo, on medication during that time, etiology unclear?  . Wears glasses    ROS as in subjective  Objective: BP 118/86   Pulse 90   Wt 221 lb (100.2 kg)   LMP 07/25/2016   BMI 35.67 kg/m   Gen: wd, wn, nad    Assessment: Encounter Diagnoses  Name Primary?  . Encounter for completion of form with patient Yes  . Screening for condition   . Screening for tuberculosis      Plan: Labs today, PPD placed, forms completed pending labs.   return in 48-72 hours for PPD readings.   Dawn Goodwin was seen today for advice only.  Diagnoses and all orders for this visit:  Encounter for completion of form with patient -     Measles/Mumps/Rubella Immunity -     Hepatitis B surface antibody  Screening for condition -     Measles/Mumps/Rubella Immunity -     Hepatitis B surface antibody  Screening for tuberculosis

## 2016-08-01 NOTE — Addendum Note (Signed)
Addended by: Billie Lade on: 08/01/2016 03:32 PM   Modules accepted: Orders

## 2016-08-02 LAB — MEASLES/MUMPS/RUBELLA IMMUNITY
Mumps IgG: 138 AU/mL — ABNORMAL HIGH (ref <9.00)
RUBEOLA IGG: 128 [AU]/ml — AB (ref <25.00)
Rubella: 4.93 Index — ABNORMAL HIGH (ref <0.90)

## 2016-08-02 LAB — HEPATITIS B SURFACE ANTIBODY, QUANTITATIVE

## 2016-08-03 LAB — TB SKIN TEST: TB SKIN TEST: NEGATIVE

## 2016-08-05 ENCOUNTER — Telehealth: Payer: Self-pay | Admitting: Internal Medicine

## 2016-08-05 ENCOUNTER — Other Ambulatory Visit (INDEPENDENT_AMBULATORY_CARE_PROVIDER_SITE_OTHER): Payer: Medicaid Other

## 2016-08-05 DIAGNOSIS — Z23 Encounter for immunization: Secondary | ICD-10-CM | POA: Diagnosis not present

## 2016-08-05 NOTE — Telephone Encounter (Signed)
Pt is wanting to find out when she has a TB skin test last year. When I called her [employer]) to get the recent immunizations no TB skin test was done. I have called and left a message for her to call me back

## 2016-08-05 NOTE — Telephone Encounter (Signed)
Pt was notified.  

## 2016-08-25 ENCOUNTER — Encounter: Payer: Self-pay | Admitting: Medical

## 2016-08-25 ENCOUNTER — Ambulatory Visit (INDEPENDENT_AMBULATORY_CARE_PROVIDER_SITE_OTHER): Payer: Medicaid Other | Admitting: Medical

## 2016-08-25 VITALS — BP 130/70 | HR 92 | Resp 16 | Ht 66.5 in | Wt 226.6 lb

## 2016-08-25 DIAGNOSIS — E669 Obesity, unspecified: Secondary | ICD-10-CM

## 2016-08-25 DIAGNOSIS — Z23 Encounter for immunization: Secondary | ICD-10-CM

## 2016-08-25 DIAGNOSIS — Z1239 Encounter for other screening for malignant neoplasm of breast: Secondary | ICD-10-CM | POA: Diagnosis not present

## 2016-08-25 DIAGNOSIS — M549 Dorsalgia, unspecified: Secondary | ICD-10-CM

## 2016-08-25 DIAGNOSIS — Z113 Encounter for screening for infections with a predominantly sexual mode of transmission: Secondary | ICD-10-CM | POA: Diagnosis not present

## 2016-08-25 DIAGNOSIS — Z0000 Encounter for general adult medical examination without abnormal findings: Secondary | ICD-10-CM | POA: Diagnosis not present

## 2016-08-25 DIAGNOSIS — L709 Acne, unspecified: Secondary | ICD-10-CM | POA: Insufficient documentation

## 2016-08-25 DIAGNOSIS — G8929 Other chronic pain: Secondary | ICD-10-CM

## 2016-08-25 DIAGNOSIS — Z131 Encounter for screening for diabetes mellitus: Secondary | ICD-10-CM

## 2016-08-25 LAB — POCT URINALYSIS DIPSTICK
BILIRUBIN UA: NEGATIVE
Glucose, UA: NEGATIVE
KETONES UA: NEGATIVE
Nitrite, UA: NEGATIVE
PH UA: 7
Protein, UA: NEGATIVE
Spec Grav, UA: 1.025
Urobilinogen, UA: NEGATIVE

## 2016-08-25 LAB — CBC
HCT: 37.9 % (ref 35.0–45.0)
Hemoglobin: 12.5 g/dL (ref 11.7–15.5)
MCH: 29.4 pg (ref 27.0–33.0)
MCHC: 33 g/dL (ref 32.0–36.0)
MCV: 89.2 fL (ref 80.0–100.0)
MPV: 10.6 fL (ref 7.5–12.5)
PLATELETS: 281 10*3/uL (ref 140–400)
RBC: 4.25 MIL/uL (ref 3.80–5.10)
RDW: 13.7 % (ref 11.0–15.0)
WBC: 8.6 10*3/uL (ref 4.0–10.5)

## 2016-08-25 LAB — COMPREHENSIVE METABOLIC PANEL
ALK PHOS: 104 U/L (ref 33–115)
ALT: 18 U/L (ref 6–29)
AST: 18 U/L (ref 10–30)
Albumin: 4 g/dL (ref 3.6–5.1)
BUN: 9 mg/dL (ref 7–25)
CALCIUM: 9.1 mg/dL (ref 8.6–10.2)
CO2: 27 mmol/L (ref 20–31)
Chloride: 104 mmol/L (ref 98–110)
Creat: 0.67 mg/dL (ref 0.50–1.10)
Glucose, Bld: 106 mg/dL — ABNORMAL HIGH (ref 65–99)
Potassium: 4.4 mmol/L (ref 3.5–5.3)
Sodium: 137 mmol/L (ref 135–146)
TOTAL PROTEIN: 7.3 g/dL (ref 6.1–8.1)
Total Bilirubin: 0.5 mg/dL (ref 0.2–1.2)

## 2016-08-25 LAB — LIPID PANEL
CHOL/HDL RATIO: 3.3 ratio (ref <=5.0)
CHOLESTEROL: 143 mg/dL (ref 125–200)
HDL: 43 mg/dL — AB (ref >=46)
LDL Cholesterol: 90 mg/dL (ref <130)
Triglycerides: 50 mg/dL (ref <150)
VLDL: 10 mg/dL (ref <30)

## 2016-08-25 MED ORDER — PHENTERMINE HCL 37.5 MG PO TABS: 37.5000 mg | ORAL_TABLET | Freq: Every day | ORAL | 0 refills | Status: DC

## 2016-08-25 MED ORDER — CLINDAMYCIN PHOSPHATE 1 % EX GEL: Freq: Two times a day (BID) | CUTANEOUS | 2 refills | Status: DC

## 2016-08-25 MED ORDER — POLYETHYLENE GLYCOL 3350 17 GM/SCOOP PO POWD: ORAL | 2 refills | Status: DC
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
## 2016-08-25 NOTE — Addendum Note (Signed)
Addended by: Arley Phenix L on: 08/25/2016 10:33 AM   Modules accepted: Orders

## 2016-08-26 LAB — HIV ANTIBODY (ROUTINE TESTING W REFLEX): HIV 1&2 Ab, 4th Generation: NONREACTIVE

## 2016-08-26 LAB — GC/CHLAMYDIA PROBE AMP
CT Probe RNA: NOT DETECTED
GC Probe RNA: NOT DETECTED

## 2016-08-26 LAB — RPR

## 2016-08-26 LAB — HEMOGLOBIN A1C
Hgb A1c MFr Bld: 5 % (ref <5.7)
Mean Plasma Glucose: 97 mg/dL

## 2016-08-29 ENCOUNTER — Telehealth: Payer: Self-pay

## 2016-08-29 ENCOUNTER — Other Ambulatory Visit: Payer: Self-pay | Admitting: Medical

## 2016-08-29 DIAGNOSIS — Z1231 Encounter for screening mammogram for malignant neoplasm of breast: Secondary | ICD-10-CM

## 2016-08-29 NOTE — Telephone Encounter (Signed)
I sent Clindagel.  Was that one reasonably priced.

## 2016-08-29 NOTE — Telephone Encounter (Signed)
Pt called the office to let us know that the 2 creams from her dermatologist that are $100 is the tretinoin cream and hydroquinone cream 4 %. She has not used them in over 1 yr. [place] December

## 2016-08-29 NOTE — Telephone Encounter (Signed)
Yes pt already has the clindagel and has been using it for the weekend. She just said you wanted these creams documented for future reference

## 2016-08-31 ENCOUNTER — Ambulatory Visit
Admission: RE | Admit: 2016-08-31 | Discharge: 2016-08-31 | Disposition: A | Discharge status: Home / Self Care | Payer: Medicaid Other | Source: Ambulatory Visit | Attending: Medical | Admitting: Medical

## 2016-08-31 DIAGNOSIS — Z1231 Encounter for screening mammogram for malignant neoplasm of breast: Secondary | ICD-10-CM

## 2016-09-05 ENCOUNTER — Encounter: Payer: Self-pay | Admitting: Internal Medicine

## 2016-10-05 ENCOUNTER — Other Ambulatory Visit (INDEPENDENT_AMBULATORY_CARE_PROVIDER_SITE_OTHER): Payer: Medicaid Other

## 2016-10-05 DIAGNOSIS — Z23 Encounter for immunization: Secondary | ICD-10-CM | POA: Diagnosis not present

## 2016-12-22 ENCOUNTER — Telehealth: Payer: Self-pay | Admitting: Medical

## 2016-12-22 NOTE — Telephone Encounter (Signed)
Called Medicaid t# [phone] P.A. Clindamycin approved til 12/17/17 PA# [id number].  Called pt & informed

## 2016-12-22 NOTE — Telephone Encounter (Signed)
P.A. CLINDAMYCIN GEL

## 2017-02-06 ENCOUNTER — Other Ambulatory Visit (INDEPENDENT_AMBULATORY_CARE_PROVIDER_SITE_OTHER): Payer: Medicaid Other

## 2017-02-06 DIAGNOSIS — Z23 Encounter for immunization: Secondary | ICD-10-CM

## 2017-03-22 ENCOUNTER — Ambulatory Visit (INDEPENDENT_AMBULATORY_CARE_PROVIDER_SITE_OTHER): Payer: Medicaid Other | Admitting: Family Medicine

## 2017-03-22 ENCOUNTER — Other Ambulatory Visit: Payer: Self-pay | Admitting: Medical

## 2017-03-22 ENCOUNTER — Encounter: Payer: Self-pay | Admitting: Family Medicine

## 2017-03-22 ENCOUNTER — Telehealth: Payer: Self-pay

## 2017-03-22 VITALS — BP 120/80 | HR 94 | Temp 98.6°F | Wt 227.0 lb

## 2017-03-22 DIAGNOSIS — J019 Acute sinusitis, unspecified: Secondary | ICD-10-CM

## 2017-03-22 DIAGNOSIS — J301 Allergic rhinitis due to pollen: Secondary | ICD-10-CM

## 2017-03-22 MED ORDER — AMOXICILLIN 875 MG PO TABS: 875.0000 mg | ORAL_TABLET | Freq: Two times a day (BID) | ORAL | 0 refills | Status: DC

## 2017-03-22 MED ORDER — FLUCONAZOLE 150 MG PO TABS: 150.0000 mg | ORAL_TABLET | Freq: Once | ORAL | 0 refills | Status: AC

## 2017-03-22 MED ORDER — FLUTICASONE PROPIONATE 50 MCG/ACT NA SUSP: 2.0000 | Freq: Every day | NASAL | 6 refills | Status: DC

## 2017-03-22 NOTE — Telephone Encounter (Signed)
Sent this to pharmacy and notified the pt.

## 2017-03-22 NOTE — Telephone Encounter (Signed)
Pt was seen today and was given amoxicillin when she has taken this in past she normally get yeast infection  and wants to know if she can have some thing on hand just in case.

## 2017-03-22 NOTE — Telephone Encounter (Signed)
Please send in diflucan 150 mg x 1 dose no refills for her.

## 2017-03-22 NOTE — Progress Notes (Signed)
Subjective:  Dawn Goodwin is a 41 y.o. female who presents for a 9 day history of thick nasal drainage, bilateral eye drainage and redness, nasal congestion, frontal headache, sore throat, post nasal drainage, and dry cough.  Denies smoking. No recent antibiotics.  Denies history of asthma, bronchitis or pneumonia.  Reports history of allergies to pollen.   Denies fever, chills, body aches, ear pain, chest pain, palpitations, shortness of breath, wheezing, N/V/D.   Treatment to date: ibuprofen and allergy eye drops. Mucinex. Denies sick contacts.  No other aggravating or relieving factors.  No other c/o.  LMP: 2 weeks ago.   ROS as in subjective.   Objective: Vitals:   03/22/17 1327  BP: 120/80  Pulse: 94  Temp: 98.6 F (37 C)    General appearance: Alert, WD/WN, no distress, mildly ill appearing                             Skin: warm, no rash                           Head: mild frontal and maxillary sinus tenderness                            Eyes: conjunctiva normal, corneas clear, PERRLA                            Ears: pearly TMs, external ear canals normal                          Nose: septum midline, turbinates swollen, with erythema and thick discharge             Mouth/throat: MMM, tongue normal, mild pharyngeal erythema                           Neck: supple, no adenopathy, no thyromegaly, nontender                          Heart: RRR, normal S1, S2, no murmurs                         Lungs: CTA bilaterally, no wheezes, rales, or rhonchi      Assessment: Acute non-recurrent sinusitis, unspecified location - Plan: amoxicillin (AMOXIL) 875 MG tablet  Acute seasonal allergic rhinitis due to pollen - Plan: fluticasone (FLONASE) 50 MCG/ACT nasal spray   Plan: Discussed diagnosis and treatment of acute sinusitis and seasonal allergies. Amoxicillin sent to pharmacy.  Suggested symptomatic OTC remedies. Nasal saline spray for congestion.  Tylenol or Ibuprofen OTC  for fever and malaise. Recommend using Flonase and treating allergies.  Call/return if worse or not back to baseline after completing the antibiotic.

## 2017-05-08 ENCOUNTER — Encounter: Payer: Medicaid Other | Admitting: Medical

## 2017-05-10 ENCOUNTER — Ambulatory Visit: Payer: Medicaid Other | Admitting: Medical

## 2017-05-10 NOTE — Progress Notes (Signed)
error 

## 2017-06-01 ENCOUNTER — Telehealth: Payer: Self-pay | Admitting: Medical

## 2017-06-01 ENCOUNTER — Other Ambulatory Visit: Payer: Self-pay | Admitting: Medical

## 2017-06-01 MED ORDER — PHENTERMINE HCL 37.5 MG PO TABS: 37.5000 mg | ORAL_TABLET | Freq: Every day | ORAL | 0 refills | Status: DC

## 2017-06-01 NOTE — Telephone Encounter (Signed)
Call out phentermine #30, no refill, need f/u appt within 4- 6 wk

## 2017-06-01 NOTE — Telephone Encounter (Signed)
She inquired about weight loss medication refill today

## 2017-06-02 NOTE — Telephone Encounter (Signed)
done

## 2017-08-01 ENCOUNTER — Other Ambulatory Visit: Payer: Self-pay | Admitting: Medical

## 2017-08-01 DIAGNOSIS — Z1231 Encounter for screening mammogram for malignant neoplasm of breast: Secondary | ICD-10-CM

## 2017-08-22 ENCOUNTER — Encounter: Payer: Medicaid Other | Admitting: Medical

## 2017-09-04 ENCOUNTER — Ambulatory Visit: Payer: Medicaid Other

## 2017-09-06 ENCOUNTER — Ambulatory Visit
Admission: RE | Admit: 2017-09-06 | Discharge: 2017-09-06 | Disposition: A | Discharge status: Home / Self Care | Payer: Medicaid Other | Source: Ambulatory Visit | Attending: Medical | Admitting: Medical

## 2017-09-06 DIAGNOSIS — Z1231 Encounter for screening mammogram for malignant neoplasm of breast: Secondary | ICD-10-CM

## 2017-10-03 ENCOUNTER — Ambulatory Visit (INDEPENDENT_AMBULATORY_CARE_PROVIDER_SITE_OTHER): Payer: Medicaid Other | Admitting: Medical

## 2017-10-03 ENCOUNTER — Encounter: Payer: Self-pay | Admitting: Medical

## 2017-10-03 ENCOUNTER — Ambulatory Visit
Admission: RE | Admit: 2017-10-03 | Discharge: 2017-10-03 | Disposition: A | Discharge status: Home / Self Care | Payer: Medicaid Other | Source: Ambulatory Visit | Attending: Medical | Admitting: Medical

## 2017-10-03 ENCOUNTER — Other Ambulatory Visit (HOSPITAL_COMMUNITY)
Admission: RE | Admit: 2017-10-03 | Discharge: 2017-10-03 | Disposition: A | Discharge status: Home / Self Care | Payer: Medicaid Other | Source: Ambulatory Visit | Attending: Medical | Admitting: Medical

## 2017-10-03 ENCOUNTER — Other Ambulatory Visit: Payer: Self-pay | Admitting: Medical

## 2017-10-03 VITALS — BP 114/72 | HR 83 | Ht 66.0 in | Wt 201.2 lb

## 2017-10-03 DIAGNOSIS — Z6832 Body mass index (BMI) 32.0-32.9, adult: Secondary | ICD-10-CM

## 2017-10-03 DIAGNOSIS — Z0000 Encounter for general adult medical examination without abnormal findings: Secondary | ICD-10-CM

## 2017-10-03 DIAGNOSIS — Z23 Encounter for immunization: Secondary | ICD-10-CM | POA: Diagnosis not present

## 2017-10-03 DIAGNOSIS — M62838 Other muscle spasm: Secondary | ICD-10-CM | POA: Diagnosis not present

## 2017-10-03 DIAGNOSIS — M25561 Pain in right knee: Secondary | ICD-10-CM

## 2017-10-03 DIAGNOSIS — Z113 Encounter for screening for infections with a predominantly sexual mode of transmission: Secondary | ICD-10-CM | POA: Diagnosis not present

## 2017-10-03 DIAGNOSIS — Z124 Encounter for screening for malignant neoplasm of cervix: Secondary | ICD-10-CM | POA: Diagnosis present

## 2017-10-03 DIAGNOSIS — E669 Obesity, unspecified: Secondary | ICD-10-CM | POA: Diagnosis not present

## 2017-10-03 DIAGNOSIS — M549 Dorsalgia, unspecified: Secondary | ICD-10-CM | POA: Diagnosis not present

## 2017-10-03 DIAGNOSIS — G8929 Other chronic pain: Secondary | ICD-10-CM

## 2017-10-03 MED ORDER — GLUCOSAMINE-CHONDROITIN 500-400 MG PO CAPS: 1.0000 | ORAL_CAPSULE | Freq: Two times a day (BID) | ORAL | 1 refills | Status: DC

## 2017-10-03 NOTE — Addendum Note (Signed)
Addended by: Tyrone Apple on: 10/03/2017 12:03 PM   Modules accepted: Orders
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
## 2017-10-04 LAB — CBC
HCT: 35.6 % (ref 35.0–45.0)
Hemoglobin: 12.1 g/dL (ref 11.7–15.5)
MCH: 30.3 pg (ref 27.0–33.0)
MCHC: 34 g/dL (ref 32.0–36.0)
MCV: 89 fL (ref 80.0–100.0)
MPV: 11.5 fL (ref 7.5–12.5)
PLATELETS: 286 10*3/uL (ref 140–400)
RBC: 4 10*6/uL (ref 3.80–5.10)
RDW: 12.4 % (ref 11.0–15.0)
WBC: 7.8 10*3/uL (ref 3.8–10.8)

## 2017-10-04 LAB — COMPREHENSIVE METABOLIC PANEL
AG RATIO: 1.3 (calc) (ref 1.0–2.5)
ALT: 11 U/L (ref 6–29)
AST: 15 U/L (ref 10–30)
Albumin: 3.9 g/dL (ref 3.6–5.1)
Alkaline phosphatase (APISO): 85 U/L (ref 33–115)
BUN: 11 mg/dL (ref 7–25)
CHLORIDE: 105 mmol/L (ref 98–110)
CO2: 26 mmol/L (ref 20–32)
Calcium: 8.9 mg/dL (ref 8.6–10.2)
Creat: 0.72 mg/dL (ref 0.50–1.10)
GLOBULIN: 3 g/dL (ref 1.9–3.7)
GLUCOSE: 74 mg/dL (ref 65–99)
POTASSIUM: 3.9 mmol/L (ref 3.5–5.3)
Sodium: 140 mmol/L (ref 135–146)
Total Bilirubin: 0.7 mg/dL (ref 0.2–1.2)
Total Protein: 6.9 g/dL (ref 6.1–8.1)

## 2017-10-04 LAB — LIPID PANEL
CHOL/HDL RATIO: 3.1 (calc) (ref <5.0)
Cholesterol: 135 mg/dL (ref <200)
HDL: 43 mg/dL — ABNORMAL LOW (ref >50)
LDL Cholesterol (Calc): 79 mg/dL (calc)
NON-HDL CHOLESTEROL (CALC): 92 mg/dL (ref <130)
Triglycerides: 45 mg/dL (ref <150)

## 2017-10-04 LAB — RPR: RPR Ser Ql: NONREACTIVE

## 2017-10-04 LAB — HIV ANTIBODY (ROUTINE TESTING W REFLEX): HIV 1&2 Ab, 4th Generation: NONREACTIVE

## 2017-10-04 LAB — HEMOGLOBIN A1C
HEMOGLOBIN A1C: 5 %{Hb} (ref <5.7)
MEAN PLASMA GLUCOSE: 97 (calc)
eAG (mmol/L): 5.4 (calc)

## 2017-10-05 LAB — CYTOLOGY - PAP
CHLAMYDIA, DNA PROBE: NEGATIVE
DIAGNOSIS: NEGATIVE
NEISSERIA GONORRHEA: NEGATIVE

## 2017-11-06 ENCOUNTER — Encounter: Payer: Self-pay | Admitting: Medical

## 2017-11-06 ENCOUNTER — Ambulatory Visit: Payer: Medicaid Other | Admitting: Medical

## 2017-11-06 VITALS — Wt 207.6 lb

## 2017-11-06 DIAGNOSIS — R42 Dizziness and giddiness: Secondary | ICD-10-CM

## 2017-11-06 DIAGNOSIS — G4489 Other headache syndrome: Secondary | ICD-10-CM

## 2017-11-06 MED ORDER — MECLIZINE HCL 25 MG PO TABS: 25.0000 mg | ORAL_TABLET | Freq: Two times a day (BID) | ORAL | 0 refills | Status: DC

## 2017-11-06 NOTE — Patient Instructions (Signed)
Vertigo Vertigo means that you feel like you are moving when you are not. Vertigo can also make you feel like things around you are moving when they are not. This feeling can come and go at any time. Vertigo often goes away on its own. Follow these instructions at home:  Avoid making fast movements.  Avoid driving.  Avoid using heavy machinery.  Avoid doing any task or activity that might cause danger to you or other people if you would have a vertigo attack while you are doing it.  Sit down right away if you feel dizzy or have trouble with your balance.  Take over-the-counter and prescription medicines only as told by your doctor.  Follow instructions from your doctor about which positions or movements you should avoid.  Drink enough fluid to keep your pee (urine) clear or pale yellow.  Keep all follow-up visits as told by your doctor. This is important. Contact a doctor if:  Medicine does not help your vertigo.  You have a fever.  Your problems get worse or you have new symptoms.  Your family or friends see changes in your behavior.  You feel sick to your stomach (nauseous) or you throw up (vomit).  You have a "pins and needles" feeling or you are numb in part of your body. Get help right away if:  You have trouble moving or talking.  You are always dizzy.  You pass out (faint).  You get very bad headaches.  You feel weak or have trouble using your hands, arms, or legs.  You have changes in your hearing.  You have changes in your seeing (vision).  You get a stiff neck.  Bright light starts to bother you. This information is not intended to replace advice given to you by your health care provider. Make sure you discuss any questions you have with your health care provider. Document Released: 09/13/2008 Document Revised: 05/12/2016 Document Reviewed: 03/30/2015 Elsevier Interactive Patient Education  [employer].

## 2017-11-06 NOTE — Progress Notes (Signed)
Subjective: Chief Complaint  Patient presents with  . Dizziness    dizzness and headaches x1 weeks   Here for c/o dizziness and headaches.  A week ago had bad spell of dizziness.  She had walked her son into school, then when she went to dry from the school started feeling the dizziness.   It got a little better, but then felt it again when she got to work.  She had to sit down.   Drank vinegar and water, took an ibuprofen after she sat down.   Was only able to work a few hours that day.   When she would lie down, the room would spin.  symptoms were worse lying.   Had a few isolated episodes of dizziness last Monday.   didn't have any more dizziness until today.  Had another episode this morning.  She notes 3 days ago vomited for no reason once.    Been having headaches some, occasional, few times per month.   Usually the headache last a few hours.  Uses ibuprofen for acute headache but not daily. These headaches are throbbing, not severe, no nausea, no photophobia.  Works 2 jobs, and has special needs son, feels under stress. No other aggravating or relieving factors. No other complaint.   Past Medical History:  Diagnosis Date  . Acne   . Back pain 2002   went through PT   . GERD (gastroesophageal reflux disease)    intermittent  . Headache   . History of UTI    once prior as of 03/2015  . Knee pain 2007   right  . Pregnancy induced hypertension   . Seizure disorder ([place])    from infancy til age 16yo, on medication during that time, etiology unclear?  . Wears glasses    Current Outpatient Medications on File Prior to Visit  Medication Sig Dispense Refill  . Multiple Vitamin (MULTIVITAMIN) tablet Take 1 tablet by mouth daily.     No current facility-administered medications on file prior to visit.    ROS as in subjective    Objective: Wt 207 lb 9.6 oz (94.2 kg)   SpO2 98%   BMI 33.51 kg/m   General appearance: alert, no distress, WD/WN,  HEENT: normocephalic, sclerae  anicteric, PERRLA, EOMi, nares patent, no discharge or erythema, pharynx normal Oral cavity: MMM, no lesions Neck: supple, no lymphadenopathy, no thyromegaly, no masses Heart: RRR, normal S1, S2, no murmurs Lungs: CTA bilaterally, no wheezes, rhonchi, or rales Extremities: no edema, no cyanosis, no clubbing Pulses: 2+ symmetric, upper and lower extremities, normal cap refill Neurological: alert, oriented x 3, CN2-12 intact, strength normal upper extremities and lower extremities, sensation normal throughout, DTRs 2+ throughout, no cerebellar signs, gait normal Psychiatric: normal affect, behavior normal, pleasant    Assessment: Encounter Diagnoses  Name Primary?  . Vertigo Yes  . Headache syndrome     Plan: Discussed symptoms, concerns. Begin trial of meclizine prn.  Discussed usual nature of this condition and other potential treatments if symptoms worsen.  Avoid sudden motions, discussed usual short term nature of this.  Call/return if worse or not improving.  headaches - likely stress related.  Can use OTC analgesic prn, work on getting adequate sleep, avoid skipping meals for long periods.  If worse symptoms or more frequent headaches then recheck.   Dawn Goodwin was seen today for dizziness.  Diagnoses and all orders for this visit:  Vertigo  Headache syndrome  Other orders -     meclizine (ANTIVERT)  25 MG tablet; Take 1 tablet (25 mg total) 2 (two) times daily by mouth.

## 2017-11-14 DIAGNOSIS — L658 Other specified nonscarring hair loss: Secondary | ICD-10-CM | POA: Diagnosis not present

## 2017-11-14 DIAGNOSIS — L089 Local infection of the skin and subcutaneous tissue, unspecified: Secondary | ICD-10-CM | POA: Diagnosis not present

## 2017-11-14 DIAGNOSIS — L700 Acne vulgaris: Secondary | ICD-10-CM | POA: Diagnosis not present

## 2017-11-14 DIAGNOSIS — L810 Postinflammatory hyperpigmentation: Secondary | ICD-10-CM | POA: Diagnosis not present

## 2018-07-06 DIAGNOSIS — L658 Other specified nonscarring hair loss: Secondary | ICD-10-CM | POA: Diagnosis not present

## 2018-07-06 DIAGNOSIS — L810 Postinflammatory hyperpigmentation: Secondary | ICD-10-CM | POA: Diagnosis not present

## 2018-07-06 DIAGNOSIS — L708 Other acne: Secondary | ICD-10-CM | POA: Diagnosis not present

## 2018-07-06 DIAGNOSIS — L700 Acne vulgaris: Secondary | ICD-10-CM | POA: Diagnosis not present

## 2018-07-06 DIAGNOSIS — Z79899 Other long term (current) drug therapy: Secondary | ICD-10-CM | POA: Diagnosis not present

## 2018-07-31 ENCOUNTER — Other Ambulatory Visit: Payer: Self-pay | Admitting: Medical

## 2018-07-31 DIAGNOSIS — Z1231 Encounter for screening mammogram for malignant neoplasm of breast: Secondary | ICD-10-CM

## 2018-09-12 ENCOUNTER — Ambulatory Visit
Admission: RE | Admit: 2018-09-12 | Discharge: 2018-09-12 | Disposition: A | Discharge status: Home / Self Care | Payer: Medicaid Other | Source: Ambulatory Visit | Attending: Medical | Admitting: Medical

## 2018-09-12 DIAGNOSIS — Z1231 Encounter for screening mammogram for malignant neoplasm of breast: Secondary | ICD-10-CM

## 2018-10-04 ENCOUNTER — Ambulatory Visit: Payer: Medicaid Other | Admitting: Medical

## 2018-10-04 ENCOUNTER — Encounter: Payer: Self-pay | Admitting: Medical

## 2018-10-04 VITALS — BP 120/80 | HR 88 | Ht 67.5 in | Wt 205.8 lb

## 2018-10-04 DIAGNOSIS — Z6832 Body mass index (BMI) 32.0-32.9, adult: Secondary | ICD-10-CM | POA: Diagnosis not present

## 2018-10-04 DIAGNOSIS — E669 Obesity, unspecified: Secondary | ICD-10-CM | POA: Diagnosis not present

## 2018-10-04 DIAGNOSIS — Z131 Encounter for screening for diabetes mellitus: Secondary | ICD-10-CM

## 2018-10-04 DIAGNOSIS — Z113 Encounter for screening for infections with a predominantly sexual mode of transmission: Secondary | ICD-10-CM | POA: Diagnosis not present

## 2018-10-04 DIAGNOSIS — M25561 Pain in right knee: Secondary | ICD-10-CM | POA: Diagnosis not present

## 2018-10-04 DIAGNOSIS — Z0000 Encounter for general adult medical examination without abnormal findings: Secondary | ICD-10-CM

## 2018-10-04 DIAGNOSIS — Z23 Encounter for immunization: Secondary | ICD-10-CM
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
## 2018-10-05 DIAGNOSIS — Z23 Encounter for immunization: Secondary | ICD-10-CM | POA: Diagnosis not present

## 2018-10-05 DIAGNOSIS — Z113 Encounter for screening for infections with a predominantly sexual mode of transmission: Secondary | ICD-10-CM | POA: Diagnosis not present

## 2018-10-05 DIAGNOSIS — Z131 Encounter for screening for diabetes mellitus: Secondary | ICD-10-CM | POA: Diagnosis not present

## 2018-10-05 DIAGNOSIS — Z6832 Body mass index (BMI) 32.0-32.9, adult: Secondary | ICD-10-CM | POA: Diagnosis not present

## 2018-10-05 DIAGNOSIS — M25561 Pain in right knee: Secondary | ICD-10-CM | POA: Diagnosis not present

## 2018-10-05 DIAGNOSIS — Z0000 Encounter for general adult medical examination without abnormal findings: Secondary | ICD-10-CM | POA: Diagnosis not present

## 2018-10-05 DIAGNOSIS — E669 Obesity, unspecified: Secondary | ICD-10-CM | POA: Diagnosis not present

## 2018-10-05 LAB — COMPREHENSIVE METABOLIC PANEL
ALBUMIN: 3.9 g/dL (ref 3.5–5.5)
ALK PHOS: 81 IU/L (ref 39–117)
ALT: 15 IU/L (ref 0–32)
AST: 16 IU/L (ref 0–40)
Albumin/Globulin Ratio: 1.3 (ref 1.2–2.2)
BILIRUBIN TOTAL: 0.3 mg/dL (ref 0.0–1.2)
BUN / CREAT RATIO: 13 (ref 9–23)
BUN: 9 mg/dL (ref 6–24)
CHLORIDE: 104 mmol/L (ref 96–106)
CO2: 25 mmol/L (ref 20–29)
CREATININE: 0.69 mg/dL (ref 0.57–1.00)
Calcium: 8.9 mg/dL (ref 8.7–10.2)
GFR calc Af Amer: 124 mL/min/{1.73_m2} (ref >59)
GFR calc non Af Amer: 108 mL/min/{1.73_m2} (ref >59)
GLUCOSE: 83 mg/dL (ref 65–99)
Globulin, Total: 2.9 g/dL (ref 1.5–4.5)
Potassium: 4 mmol/L (ref 3.5–5.2)
Sodium: 143 mmol/L (ref 134–144)
Total Protein: 6.8 g/dL (ref 6.0–8.5)

## 2018-10-05 LAB — CBC
HEMATOCRIT: 36.8 % (ref 34.0–46.6)
HEMOGLOBIN: 12.2 g/dL (ref 11.1–15.9)
MCH: 29.8 pg (ref 26.6–33.0)
MCHC: 33.2 g/dL (ref 31.5–35.7)
MCV: 90 fL (ref 79–97)
Platelets: 258 10*3/uL (ref 150–450)
RBC: 4.1 x10E6/uL (ref 3.77–5.28)
RDW: 12.6 % (ref 12.3–15.4)
WBC: 6.4 10*3/uL (ref 3.4–10.8)

## 2018-10-05 LAB — HEMOGLOBIN A1C
Est. average glucose Bld gHb Est-mCnc: 100 mg/dL
Hgb A1c MFr Bld: 5.1 % (ref 4.8–5.6)

## 2018-10-05 LAB — HIV ANTIBODY (ROUTINE TESTING W REFLEX): HIV SCREEN 4TH GENERATION: NONREACTIVE

## 2018-10-05 LAB — VITAMIN D 25 HYDROXY (VIT D DEFICIENCY, FRACTURES): VIT D 25 HYDROXY: 33.5 ng/mL (ref 30.0–100.0)

## 2018-10-05 LAB — RPR: RPR: NONREACTIVE

## 2018-10-06 LAB — GC/CHLAMYDIA PROBE AMP
Chlamydia trachomatis, NAA: NEGATIVE
Neisseria gonorrhoeae by PCR: NEGATIVE

## 2018-10-25 DIAGNOSIS — L659 Nonscarring hair loss, unspecified: Secondary | ICD-10-CM | POA: Diagnosis not present

## 2018-10-25 DIAGNOSIS — Z630 Problems in relationship with spouse or partner: Secondary | ICD-10-CM | POA: Diagnosis not present

## 2019-02-18 ENCOUNTER — Telehealth: Payer: Self-pay | Admitting: Medical

## 2019-02-18 NOTE — Telephone Encounter (Signed)
Yes please refer to nutritionist

## 2019-02-18 NOTE — Telephone Encounter (Signed)
Pt called and stated she was to see a nutritionist and she hasn't heard anything. Please call pt at [phone].

## 2019-02-18 NOTE — Telephone Encounter (Signed)
Patient would like to nutritionist and I do not see any orders for that.   Please advise?

## 2019-02-19 ENCOUNTER — Other Ambulatory Visit: Payer: Self-pay

## 2019-02-19 DIAGNOSIS — Z6832 Body mass index (BMI) 32.0-32.9, adult: Principal | ICD-10-CM

## 2019-02-19 DIAGNOSIS — E669 Obesity, unspecified: Secondary | ICD-10-CM

## 2019-02-19 NOTE — Telephone Encounter (Signed)
Referral sent in epic

## 2019-05-06 ENCOUNTER — Ambulatory Visit: Payer: Medicaid Other | Admitting: Dietician

## 2019-07-24 ENCOUNTER — Ambulatory Visit: Payer: Medicaid Other | Admitting: Dietician

## 2019-08-08 ENCOUNTER — Other Ambulatory Visit: Payer: Self-pay

## 2019-08-08 ENCOUNTER — Ambulatory Visit: Payer: 59 | Admitting: Medical

## 2019-08-08 ENCOUNTER — Encounter: Payer: Self-pay | Admitting: Medical

## 2019-08-08 VITALS — BP 110/74 | HR 79 | Temp 98.6°F | Ht 67.0 in | Wt 197.0 lb

## 2019-08-08 DIAGNOSIS — E669 Obesity, unspecified: Secondary | ICD-10-CM | POA: Diagnosis not present

## 2019-08-08 DIAGNOSIS — M25562 Pain in left knee: Secondary | ICD-10-CM

## 2019-08-08 DIAGNOSIS — M7661 Achilles tendinitis, right leg: Secondary | ICD-10-CM

## 2019-08-08 DIAGNOSIS — M25561 Pain in right knee: Secondary | ICD-10-CM | POA: Diagnosis not present

## 2019-08-08 MED ORDER — PHENTERMINE HCL 37.5 MG PO TABS: 37.5000 mg | ORAL_TABLET | Freq: Every day | ORAL | 1 refills | Status: DC

## 2019-08-08 NOTE — Patient Instructions (Addendum)
Bilateral knee pain -your symptoms and exam findings suggest inflammation of the knees likely due to recent overuse from squats and lunges.  Although I am happy that you are exercising, we just need to modify your exercise program in the short-term.    You also have evidence of some mild degenerative changes in your right knee from 2018 x-ray  You also seem to have some mild Achilles tendinitis of the right foot   Recommendations  Make sure you are using a good supportive shoe with a good arch support.  If you are not sure about this, have the trainer or a shoe store look at your shoe wear.  You have a little bit of a reduced arch on exam today  For the next 5 days I want you to use ibuprofen over-the-counter 200 mg, 3 tablets twice daily, or 600 mg twice daily  After 5 days you can use ibuprofen as needed for pain and inflammation  For the next 3 days I want you to use ice water pack or bag of frozen peas over the knees and Achilles 20 minutes on and off throughout the day, this is for pain and inflammation as well  For the next 10 days I recommend no squats, no lunges, no hard impact jumping that is going to inflame the knees  You can do the other exercises  I do recommend you incorporate more cardio exercise such as walking, hiking, bicycling, elliptical machine that is not as hard on the knees but gives you at least 40 to 60 minutes of aerobic activity several days per week  As your pain subsides, and as you seem to improve over the next 2 weeks you can gradually add back in some squats and lunges  Begin back on phentermine appetite suppressant.  Let us try 1/2 tablet instead of 1 tablet daily  Lets recheck in 1 month

## 2019-08-08 NOTE — Progress Notes (Signed)
Subjective: Chief Complaint  Patient presents with  . Foot Pain    right heel x2-3 months   . Knee Pain    bilateral-right is worse xseveral years on and off    Here for ongoing bilat knee pain and right heel pain.   Has had worse pain the last 2 months exercising, but still ongoing knee pain for over a year.   Using ibuprofen  2 days per week.  No knee swelling.   Gets pain with lunges and squats.   Trainer is having her do lunges, jump rope, leg press, squats.   The days she works out on her own, she does similar exercise but wihtout weights.   Also been getting pain in back of right heel.  Worse with jumping rope.    been working with Physiological scientist for a few months now.   The training sessions are around an hour or more 2 days per week.   She would also like to get back on phentermine to help lose weight.  Has done well on this in the past.   Is really motivated to lose weight.  Is eating healthy, back on track with exercise.  No other aggravating or relieving factors. No other complaint.  Past Medical History:  Diagnosis Date  . Acne   . Back pain 2002   went through PT   . GERD (gastroesophageal reflux disease)    intermittent  . Headache   . History of UTI    once prior as of 03/2015  . Knee pain 2007   right  . Pregnancy induced hypertension   . Seizure disorder ([place])    from infancy til age 44yo, on medication during that time, etiology unclear? 43yo  . Wears glasses    No current outpatient medications on file prior to visit.   No current facility-administered medications on file prior to visit.    ROS as in subjective   Objective: BP 110/74   Pulse 79   Temp 98.6 F (37 C) (Oral)   Ht 5' 7"  (1.702 m)   Wt 197 lb (89.4 kg)   SpO2 98%   BMI 30.85 kg/m   Wt Readings from Last 3 Encounters:  08/08/19 197 lb (89.4 kg)  10/04/18 205 lb 12.8 oz (93.4 kg)  11/06/17 207 lb 9.6 oz (94.2 kg)   Gen: wd, wn, nad Lungs clear Heart rrr, normal s1, s2, no numerus No ext  edema Pulse 2+ ue and le MSK: Mild tenderness over medial joint line bilaterally, otherwise knees nontender, mild crepitus of both knees, no laxity no swelling otherwise knees unremarkable, mild tenderness of the right distal Achilles, mild flattening of bilateral arches of feet, otherwise hips nontender normal range of motion, rest of legs unremarkable ams and legs neurovascularly intact    Assessment: Encounter Diagnoses  Name Primary?  . Pain in both knees, unspecified chronicity Yes  . Achilles tendinitis of right lower extremity   . Obesity, unspecified classification, unspecified obesity type, unspecified whether serious comorbidity present      Plan: We discussed her symptoms and concerns.  We discussed the knee pain, Achilles pain and findings.  We discussed her concern to go back on phentermine.  We discussed risk and benefits of medication.  Continue efforts with healthy diet, exercise with the modifications below.  We discussed the recommendations below.  Follow-up in 3 to 4 weeks  Patient Instructions  Bilateral knee pain -your symptoms and exam findings suggest inflammation of the knees likely due  to recent overuse from squats and lunges.  Although I am happy that you are exercising, we just need to modify your exercise program in the short-term.    You also have evidence of some mild degenerative changes in your right knee from 2018 x-ray  You also seem to have some mild Achilles tendinitis of the right foot   Recommendations  Make sure you are using a good supportive shoe with a good arch support.  If you are not sure about this, have the trainer or a shoe store look at your shoe wear.  You have a little bit of a reduced arch on exam today  For the next 5 days I want you to use ibuprofen over-the-counter 200 mg, 3 tablets twice daily, or 600 mg twice daily  After 5 days you can use ibuprofen as needed for pain and inflammation  For the next 3 days I want you to use  ice water pack or bag of frozen peas over the knees and Achilles 20 minutes on and off throughout the day, this is for pain and inflammation as well  For the next 10 days I recommend no squats, no lunges, no hard impact jumping that is going to inflame the knees  You can do the other exercises  I do recommend you incorporate more cardio exercise such as walking, hiking, bicycling, elliptical machine that is not as hard on the knees but gives you at least 40 to 60 minutes of aerobic activity several days per week  As your pain subsides, and as you seem to improve over the next 2 weeks you can gradually add back in some squats and lunges  Begin back on phentermine appetite suppressant.  Let us try 1/2 tablet instead of 1 tablet daily  Lets recheck in 1 month    Dawn Goodwin was seen today for foot pain and knee pain.  Diagnoses and all orders for this visit:  Pain in both knees, unspecified chronicity  Achilles tendinitis of right lower extremity  Obesity, unspecified classification, unspecified obesity type, unspecified whether serious comorbidity present  Other orders -     phentermine (ADIPEX-P) 37.5 MG tablet; Take 1 tablet (37.5 mg total) by mouth daily before breakfast.    f/u 3-4 wk.

## 2019-08-29 ENCOUNTER — Ambulatory Visit: Payer: Medicaid Other | Admitting: Medical

## 2019-08-29 DIAGNOSIS — F4321 Adjustment disorder with depressed mood: Secondary | ICD-10-CM | POA: Diagnosis not present

## 2019-08-30 ENCOUNTER — Encounter: Payer: Self-pay | Admitting: Medical

## 2019-09-10 ENCOUNTER — Other Ambulatory Visit: Payer: Self-pay | Admitting: Medical

## 2019-09-10 DIAGNOSIS — Z1231 Encounter for screening mammogram for malignant neoplasm of breast: Secondary | ICD-10-CM

## 2019-09-12 DIAGNOSIS — F4321 Adjustment disorder with depressed mood: Secondary | ICD-10-CM | POA: Diagnosis not present

## 2019-10-08 ENCOUNTER — Encounter: Payer: Self-pay | Admitting: Medical

## 2019-10-08 ENCOUNTER — Other Ambulatory Visit: Payer: Self-pay

## 2019-10-08 ENCOUNTER — Ambulatory Visit (INDEPENDENT_AMBULATORY_CARE_PROVIDER_SITE_OTHER): Payer: 59 | Admitting: Medical

## 2019-10-08 VITALS — BP 120/76 | HR 92 | Temp 98.2°F | Ht 67.0 in | Wt 184.6 lb

## 2019-10-08 DIAGNOSIS — Z0000 Encounter for general adult medical examination without abnormal findings: Secondary | ICD-10-CM

## 2019-10-08 DIAGNOSIS — M25561 Pain in right knee: Secondary | ICD-10-CM

## 2019-10-08 DIAGNOSIS — Z113 Encounter for screening for infections with a predominantly sexual mode of transmission: Secondary | ICD-10-CM

## 2019-10-08 DIAGNOSIS — M25562 Pain in left knee: Secondary | ICD-10-CM | POA: Diagnosis not present

## 2019-10-08 DIAGNOSIS — Z23 Encounter for immunization: Secondary | ICD-10-CM | POA: Diagnosis not present

## 2019-10-08 DIAGNOSIS — M545 Low back pain, unspecified: Secondary | ICD-10-CM | POA: Insufficient documentation

## 2019-10-08 LAB — POCT URINALYSIS DIP (PROADVANTAGE DEVICE)
Bilirubin, UA: NEGATIVE
Blood, UA: NEGATIVE
Glucose, UA: NEGATIVE mg/dL
Ketones, POC UA: NEGATIVE mg/dL
Leukocytes, UA: NEGATIVE
Nitrite, UA: NEGATIVE
Protein Ur, POC: NEGATIVE mg/dL
Specific Gravity, Urine: 1.02
Urobilinogen, Ur: NEGATIVE
pH, UA: 6 (ref 5.0–8.0)

## 2019-10-08 NOTE — Addendum Note (Signed)
Addended by: Edgar Frisk on: 10/08/2019 10:00 AM   Modules accepted: Orders
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
## 2019-10-09 ENCOUNTER — Other Ambulatory Visit: Payer: Self-pay | Admitting: Medical

## 2019-10-09 DIAGNOSIS — M25561 Pain in right knee: Secondary | ICD-10-CM

## 2019-10-09 LAB — CBC
Hematocrit: 37.7 % (ref 34.0–46.6)
Hemoglobin: 12.2 g/dL (ref 11.1–15.9)
MCH: 29.5 pg (ref 26.6–33.0)
MCHC: 32.4 g/dL (ref 31.5–35.7)
MCV: 91 fL (ref 79–97)
Platelets: 258 10*3/uL (ref 150–450)
RBC: 4.14 x10E6/uL (ref 3.77–5.28)
RDW: 12.9 % (ref 11.7–15.4)
WBC: 7.7 10*3/uL (ref 3.4–10.8)

## 2019-10-09 LAB — COMPREHENSIVE METABOLIC PANEL
ALT: 20 IU/L (ref 0–32)
AST: 15 IU/L (ref 0–40)
Albumin/Globulin Ratio: 1.4 (ref 1.2–2.2)
Albumin: 4.2 g/dL (ref 3.8–4.8)
Alkaline Phosphatase: 105 IU/L (ref 39–117)
BUN/Creatinine Ratio: 15 (ref 9–23)
BUN: 10 mg/dL (ref 6–24)
Bilirubin Total: 0.5 mg/dL (ref 0.0–1.2)
CO2: 25 mmol/L (ref 20–29)
Calcium: 9.5 mg/dL (ref 8.7–10.2)
Chloride: 102 mmol/L (ref 96–106)
Creatinine, Ser: 0.66 mg/dL (ref 0.57–1.00)
GFR calc Af Amer: 125 mL/min/{1.73_m2} (ref >59)
GFR calc non Af Amer: 109 mL/min/{1.73_m2} (ref >59)
Globulin, Total: 3 g/dL (ref 1.5–4.5)
Glucose: 86 mg/dL (ref 65–99)
Potassium: 4.8 mmol/L (ref 3.5–5.2)
Sodium: 137 mmol/L (ref 134–144)
Total Protein: 7.2 g/dL (ref 6.0–8.5)

## 2019-10-09 LAB — LIPID PANEL
Chol/HDL Ratio: 2.8 ratio (ref 0.0–4.4)
Cholesterol, Total: 132 mg/dL (ref 100–199)
HDL: 48 mg/dL (ref >39)
LDL Chol Calc (NIH): 75 mg/dL (ref 0–99)
Triglycerides: 33 mg/dL (ref 0–149)
VLDL Cholesterol Cal: 9 mg/dL (ref 5–40)

## 2019-10-09 LAB — GC/CHLAMYDIA PROBE AMP
Chlamydia trachomatis, NAA: NEGATIVE
Neisseria Gonorrhoeae by PCR: NEGATIVE

## 2019-10-09 LAB — HIV ANTIBODY (ROUTINE TESTING W REFLEX): HIV Screen 4th Generation wRfx: NONREACTIVE

## 2019-10-09 LAB — RPR: RPR Ser Ql: NONREACTIVE

## 2019-10-09 MED ORDER — PHENTERMINE HCL 37.5 MG PO TABS: 37.5000 mg | ORAL_TABLET | Freq: Every day | ORAL | 1 refills | Status: DC

## 2019-10-21 ENCOUNTER — Ambulatory Visit (INDEPENDENT_AMBULATORY_CARE_PROVIDER_SITE_OTHER): Payer: 59 | Admitting: Physician Assistant

## 2019-10-21 ENCOUNTER — Ambulatory Visit: Payer: Self-pay

## 2019-10-21 ENCOUNTER — Other Ambulatory Visit: Payer: Self-pay

## 2019-10-21 ENCOUNTER — Encounter: Payer: Self-pay | Admitting: Physician Assistant

## 2019-10-21 ENCOUNTER — Telehealth: Payer: Self-pay

## 2019-10-21 DIAGNOSIS — M25562 Pain in left knee: Secondary | ICD-10-CM | POA: Diagnosis not present

## 2019-10-21 DIAGNOSIS — M25561 Pain in right knee: Secondary | ICD-10-CM

## 2019-10-21 DIAGNOSIS — M545 Low back pain: Secondary | ICD-10-CM

## 2019-10-21 DIAGNOSIS — G8929 Other chronic pain: Secondary | ICD-10-CM

## 2019-10-21 MED ORDER — LIDOCAINE HCL 1 % IJ SOLN
3.0000 mL | INTRAMUSCULAR | Status: AC | PRN
  Administered 2019-10-21: 3 mL

## 2019-10-21 MED ORDER — DICLOFENAC SODIUM 75 MG PO TBEC: 75.0000 mg | DELAYED_RELEASE_TABLET | Freq: Two times a day (BID) | ORAL | 1 refills | Status: DC

## 2019-10-21 MED ORDER — METHYLPREDNISOLONE ACETATE 40 MG/ML IJ SUSP
40.0000 mg | INTRAMUSCULAR | Status: AC | PRN
  Administered 2019-10-21: 40 mg via INTRA_ARTICULAR

## 2019-10-21 NOTE — Telephone Encounter (Signed)
Patient stated she is ok to see urologist for back pain that is sort of relieved after urinating. She stated the pain could be from her new personal trainer but she is not sure.
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
## 2019-10-22 ENCOUNTER — Other Ambulatory Visit: Payer: Self-pay | Admitting: Radiology

## 2019-10-22 DIAGNOSIS — G8929 Other chronic pain: Secondary | ICD-10-CM

## 2019-10-22 DIAGNOSIS — M545 Low back pain, unspecified: Secondary | ICD-10-CM

## 2019-10-23 NOTE — Telephone Encounter (Signed)
Before sending to urology let us have her come in and do a clean-catch urine and urine culture to rule out infection

## 2019-10-24 NOTE — Telephone Encounter (Signed)
Patient would like you to look at the notes from Ortho and see if it could be the reason her back is hurting. Please advise. She has scheduled a lab visit.

## 2019-10-24 NOTE — Telephone Encounter (Signed)
Please review previous message.

## 2019-10-25 ENCOUNTER — Other Ambulatory Visit (INDEPENDENT_AMBULATORY_CARE_PROVIDER_SITE_OTHER): Payer: 59

## 2019-10-25 ENCOUNTER — Ambulatory Visit
Admission: RE | Admit: 2019-10-25 | Discharge: 2019-10-25 | Disposition: A | Discharge status: Home / Self Care | Payer: Medicaid Other | Source: Ambulatory Visit | Attending: Medical | Admitting: Medical

## 2019-10-25 ENCOUNTER — Other Ambulatory Visit: Payer: Self-pay

## 2019-10-25 DIAGNOSIS — Z1231 Encounter for screening mammogram for malignant neoplasm of breast: Secondary | ICD-10-CM

## 2019-10-25 DIAGNOSIS — R319 Hematuria, unspecified: Secondary | ICD-10-CM | POA: Diagnosis not present

## 2019-10-25 LAB — POCT URINALYSIS DIP (PROADVANTAGE DEVICE)
Bilirubin, UA: NEGATIVE
Glucose, UA: NEGATIVE mg/dL
Ketones, POC UA: NEGATIVE mg/dL
Leukocytes, UA: NEGATIVE
Nitrite, UA: NEGATIVE
Protein Ur, POC: NEGATIVE mg/dL
Specific Gravity, Urine: 1.03
Urobilinogen, Ur: NEGATIVE
pH, UA: 6 (ref 5.0–8.0)

## 2019-10-25 NOTE — Telephone Encounter (Signed)
I reviewed the orthopedic note. Her recent urinalysis was normal.  If she is not having burning with urination, blood in the urine, odor in the urine, and if she is not having an urgency to go or urinating more than 15 times a day, for now just continue to drink plenty of clear fluids throughout the day particularly water and assume the back pain is related to her activity and exercise and continue with the recommendations from orthopedics  If she is having symptoms above then let us recheck the urine and send it for culture in case there is an infection  If she is urinating numerous times per day she could have an overactive bladder issue which we could consider medication for  If she is having urinary leakage, incontinence, likewise, return to discuss

## 2019-10-25 NOTE — Telephone Encounter (Signed)
Patient left urine sample and it's been sent for culture

## 2019-10-26 LAB — URINE CULTURE: Organism ID, Bacteria: NO GROWTH

## 2019-10-28 ENCOUNTER — Ambulatory Visit: Payer: 59 | Admitting: Physical Therapy

## 2019-11-07 ENCOUNTER — Ambulatory Visit: Payer: 59 | Attending: Physician Assistant | Admitting: Physical Therapy

## 2019-12-11 DIAGNOSIS — L811 Chloasma: Secondary | ICD-10-CM | POA: Diagnosis not present

## 2019-12-11 DIAGNOSIS — L700 Acne vulgaris: Secondary | ICD-10-CM | POA: Diagnosis not present

## 2020-03-18 DIAGNOSIS — L700 Acne vulgaris: Secondary | ICD-10-CM | POA: Diagnosis not present

## 2020-03-18 DIAGNOSIS — L811 Chloasma: Secondary | ICD-10-CM | POA: Diagnosis not present

## 2020-04-13 ENCOUNTER — Encounter: Payer: Self-pay | Admitting: Medical

## 2020-04-13 ENCOUNTER — Telehealth: Payer: Self-pay | Admitting: Medical

## 2020-04-13 ENCOUNTER — Other Ambulatory Visit: Payer: Self-pay

## 2020-04-13 ENCOUNTER — Ambulatory Visit (INDEPENDENT_AMBULATORY_CARE_PROVIDER_SITE_OTHER): Payer: Managed Care, Other (non HMO) | Admitting: Medical

## 2020-04-13 VITALS — BP 114/70 | HR 94 | Temp 98.2°F | Ht 67.0 in | Wt 189.2 lb

## 2020-04-13 DIAGNOSIS — S6991XA Unspecified injury of right wrist, hand and finger(s), initial encounter: Secondary | ICD-10-CM | POA: Diagnosis not present

## 2020-04-13 DIAGNOSIS — S63619A Unspecified sprain of unspecified finger, initial encounter: Secondary | ICD-10-CM

## 2020-04-13 DIAGNOSIS — S29011A Strain of muscle and tendon of front wall of thorax, initial encounter: Secondary | ICD-10-CM | POA: Diagnosis not present

## 2020-04-13 MED ORDER — IBUPROFEN 600 MG PO TABS: 600.0000 mg | ORAL_TABLET | Freq: Three times a day (TID) | ORAL | 0 refills | Status: DC | PRN

## 2020-04-13 NOTE — Telephone Encounter (Signed)
Have her go to [facility] for xray.   Their hours are 8am - 4:30 pm Monday - Friday.  Take your insurance card with you.  [facility] [phone]  [place] [address], [place], [place] [address]  [address] [place], [place] [address]

## 2020-04-13 NOTE — Progress Notes (Signed)
Subjective:  Dawn Goodwin is a 44 y.o. female who presents for Chief Complaint  Patient presents with  . Hand Pain    first 2 fingers on right hand   . Chest Pain    throbbing pain right side on and off      Here today for injury.  She notes that she was doing exercises with her trainer including punching a bag.  She ended up hitting the bag with the tips of her fingers hurting them, right side.  She is right-handed.  The injury was a month ago.  But she still has pain.  Her main pain is on the right second and third fingers distally.  No swelling.  She does have some pain trying to flex the last digit of the middle finger.  No numbness or tingling.  She also notes some right sided chest pains worse if she is pushing or pulling something with her arms and chest, or worse if trying to stretch out her hand and put weight on her right hand such as in the tricep dip position.  No shortness of breath, no swelling.  No injury or trauma otherwise.  No other aggravating or relieving factors.    No other c/o.  The following portions of the patient's history were reviewed and updated as appropriate: allergies, current medications, past family history, past medical history, past social history, past surgical history and problem list.  ROS Otherwise as in subjective above  Objective: BP 114/70   Pulse 94   Temp 98.2 F (36.8 C)   Ht 5' 7"  (1.702 m)   Wt 189 lb 3.2 oz (85.8 kg)   SpO2 98%   BMI 29.63 kg/m   General appearance: alert, no distress, well developed, well nourished Skin: unremarkable, no erythema or bruising of hands or chest Right chest tenderness over right chest wall, mild tenderness with resisted chest flexion, otherwise chest nontender Back nontender Tender at the DIP of the right third finger, mild pain with flexion and flexion is slightly reduced of the right third DIP, also tender over the second DIP and distal phalanx, but no decreased range of motion no swelling no  deformity.  Rest of fingers and hand nontender.  Right arm nontender otherwise nontender and no obvious deformity Arms neurovascularly intact    Assessment: Encounter Diagnoses  Name Primary?  . Muscle strain of chest wall, initial encounter Yes  . Sprain of finger, unspecified finger, initial encounter   . Injury of finger of right hand, initial encounter      Plan: We discussed symptoms and exam findings suggestive of chest wall muscle strain, right second and third finger sprain.    Advised alternating ice and heat therapy, buddy splint to second and third fingers, right hand,, go for x-ray.  Medication as below for pain and inflammation  Dawn Goodwin was seen today for hand pain and chest pain.  Diagnoses and all orders for this visit:  Muscle strain of chest wall, initial encounter  Sprain of finger, unspecified finger, initial encounter -     DG Hand Complete Right; Future  Injury of finger of right hand, initial encounter -     DG Hand Complete Right; Future  Other orders -     ibuprofen (ADVIL) 600 MG tablet; Take 1 tablet (600 mg total) by mouth every 8 (eight) hours as needed.    Follow up: pending xray

## 2020-04-14 NOTE — Telephone Encounter (Signed)
Lmom for patient to call back for message from provider.

## 2020-04-15 ENCOUNTER — Ambulatory Visit
Admission: RE | Admit: 2020-04-15 | Discharge: 2020-04-15 | Disposition: A | Discharge status: Home / Self Care | Payer: Medicaid Other | Source: Ambulatory Visit | Attending: Medical | Admitting: Medical

## 2020-04-15 DIAGNOSIS — M79644 Pain in right finger(s): Secondary | ICD-10-CM | POA: Diagnosis not present

## 2020-04-15 DIAGNOSIS — S63619A Unspecified sprain of unspecified finger, initial encounter: Secondary | ICD-10-CM

## 2020-04-15 DIAGNOSIS — M79641 Pain in right hand: Secondary | ICD-10-CM | POA: Diagnosis not present

## 2020-04-15 DIAGNOSIS — S6991XA Unspecified injury of right wrist, hand and finger(s), initial encounter: Secondary | ICD-10-CM

## 2020-04-15 NOTE — Telephone Encounter (Signed)
Patient informed. 

## 2020-05-25 DIAGNOSIS — L700 Acne vulgaris: Secondary | ICD-10-CM | POA: Diagnosis not present

## 2020-05-25 DIAGNOSIS — L731 Pseudofolliculitis barbae: Secondary | ICD-10-CM | POA: Diagnosis not present

## 2020-05-25 DIAGNOSIS — L281 Prurigo nodularis: Secondary | ICD-10-CM | POA: Diagnosis not present

## 2020-05-25 DIAGNOSIS — L811 Chloasma: Secondary | ICD-10-CM | POA: Diagnosis not present

## 2020-05-28 ENCOUNTER — Other Ambulatory Visit: Payer: Self-pay | Admitting: Medical

## 2020-06-26 ENCOUNTER — Encounter: Payer: Self-pay | Admitting: Medical

## 2020-06-26 ENCOUNTER — Ambulatory Visit (INDEPENDENT_AMBULATORY_CARE_PROVIDER_SITE_OTHER): Payer: Managed Care, Other (non HMO) | Admitting: Medical

## 2020-06-26 ENCOUNTER — Other Ambulatory Visit: Payer: Self-pay

## 2020-06-26 VITALS — BP 132/70 | HR 95 | Ht 67.0 in | Wt 196.4 lb

## 2020-06-26 DIAGNOSIS — R002 Palpitations: Secondary | ICD-10-CM | POA: Insufficient documentation

## 2020-06-26 DIAGNOSIS — T50905A Adverse effect of unspecified drugs, medicaments and biological substances, initial encounter: Secondary | ICD-10-CM | POA: Insufficient documentation

## 2020-06-26 DIAGNOSIS — L700 Acne vulgaris: Secondary | ICD-10-CM

## 2020-06-26 DIAGNOSIS — R0600 Dyspnea, unspecified: Secondary | ICD-10-CM | POA: Diagnosis not present

## 2020-06-26 NOTE — Progress Notes (Signed)
Subjective:  Dawn Goodwin is a 44 y.o. female who presents for Chief Complaint  Patient presents with   Medication Reaction    spironolactone-chest tightness after inreasing the amount of times daily      Here for concern about medication adverse effect.  She currently is seeing dermatology for acne on the face.  Wearing mask has not been helpful either.  She was prescribed spironolactone to help with acne as well as topical creams.  She has been started on spironolactone 50 mg twice a day or 100 mg a day initially.  She recently was titrated up to 150 mg daily.  However she started feeling shortness of breath and some palpitations in her chest this past week so she stopped the medicine about a week ago.  She hydrates well.  No numbness, no tingling, no excess thirst, no cramping.  She drinks a lot of water in general.  No other aggravating or relieving factors.    No other c/o.  The following portions of the patient's history were reviewed and updated as appropriate: allergies, current medications, past family history, past medical history, past social history, past surgical history and problem list.  ROS Otherwise as in subjective above    Objective: BP 132/70    Pulse 95    Ht 5' 7"  (1.702 m)    Wt 196 lb 6.4 oz (89.1 kg)    SpO2 98%    BMI 30.76 kg/m   General appearance: alert, no distress, well developed, well nourished Neck: supple, no lymphadenopathy, no thyromegaly, no masses Heart: RRR, normal S1, S2, no murmurs Lungs: CTA bilaterally, no wheezes, rhonchi, or rales Pulses: 2+ radial pulses, 2+ pedal pulses, normal cap refill Ext: no edema   EKG  indication palpitations, dyspnea, rate 83 bpm, PR 146 ms, QRS 72 ms, QTC 418 ms, axis 69 degrees, normal sinus rhythm, possible right atrial enlargement given P wave findings,   Assessment: Encounter Diagnoses  Name Primary?   Acne vulgaris Yes   Adverse effect of drug, initial encounter    Dyspnea, unspecified type     Palpitation      Plan: We discussed her EKG and her symptoms and concerns.  EKG reviewed.  I recommended she stick to spironolactone 50 mg , but no more than 100 mg.  She is taking this for acne, but at the higher dose that she was on at 150 mg she seemed to have some symptoms.  So she will cut back on the dose and will let dermatology know that she is on the lower dose for now, 50 mg.  Nyasha was seen today for medication reaction.  Diagnoses and all orders for this visit:  Acne vulgaris  Adverse effect of drug, initial encounter -     EKG 12-Lead  Dyspnea, unspecified type -     EKG 12-Lead  Palpitation -     EKG 12-Lead    Follow up: prn

## 2020-06-29 ENCOUNTER — Encounter: Payer: Self-pay | Admitting: Medical

## 2020-07-06 DIAGNOSIS — L700 Acne vulgaris: Secondary | ICD-10-CM | POA: Diagnosis not present

## 2020-07-06 DIAGNOSIS — L731 Pseudofolliculitis barbae: Secondary | ICD-10-CM | POA: Diagnosis not present

## 2020-07-06 DIAGNOSIS — L811 Chloasma: Secondary | ICD-10-CM | POA: Diagnosis not present

## 2020-07-31 ENCOUNTER — Other Ambulatory Visit: Payer: Self-pay | Admitting: Medical

## 2020-09-15 ENCOUNTER — Other Ambulatory Visit: Payer: Self-pay | Admitting: Medical

## 2020-09-15 DIAGNOSIS — Z1231 Encounter for screening mammogram for malignant neoplasm of breast: Secondary | ICD-10-CM

## 2020-10-08 ENCOUNTER — Encounter: Payer: 59 | Admitting: Medical

## 2020-10-08 DIAGNOSIS — L811 Chloasma: Secondary | ICD-10-CM | POA: Diagnosis not present

## 2020-10-08 DIAGNOSIS — L700 Acne vulgaris: Secondary | ICD-10-CM | POA: Diagnosis not present

## 2020-10-26 ENCOUNTER — Other Ambulatory Visit: Payer: Self-pay

## 2020-10-26 ENCOUNTER — Ambulatory Visit
Admission: RE | Admit: 2020-10-26 | Discharge: 2020-10-26 | Disposition: A | Discharge status: Home / Self Care | Payer: Managed Care, Other (non HMO) | Source: Ambulatory Visit | Attending: Medical | Admitting: Medical

## 2020-10-26 DIAGNOSIS — Z1231 Encounter for screening mammogram for malignant neoplasm of breast: Secondary | ICD-10-CM

## 2020-11-05 ENCOUNTER — Other Ambulatory Visit (HOSPITAL_COMMUNITY)
Admission: RE | Admit: 2020-11-05 | Discharge: 2020-11-05 | Disposition: A | Discharge status: Home / Self Care | Payer: Managed Care, Other (non HMO) | Source: Ambulatory Visit | Attending: Medical | Admitting: Medical

## 2020-11-05 ENCOUNTER — Ambulatory Visit (INDEPENDENT_AMBULATORY_CARE_PROVIDER_SITE_OTHER): Payer: Managed Care, Other (non HMO) | Admitting: Medical

## 2020-11-05 ENCOUNTER — Encounter: Payer: Self-pay | Admitting: Medical

## 2020-11-05 ENCOUNTER — Other Ambulatory Visit: Payer: Self-pay

## 2020-11-05 VITALS — BP 126/80 | HR 95 | Ht 67.0 in | Wt 193.4 lb

## 2020-11-05 DIAGNOSIS — Z0000 Encounter for general adult medical examination without abnormal findings: Secondary | ICD-10-CM

## 2020-11-05 DIAGNOSIS — Z1321 Encounter for screening for nutritional disorder: Secondary | ICD-10-CM

## 2020-11-05 DIAGNOSIS — Z130 Encounter for screening for diseases of the blood and blood-forming organs and certain disorders involving the immune mechanism: Secondary | ICD-10-CM | POA: Insufficient documentation

## 2020-11-05 DIAGNOSIS — Z124 Encounter for screening for malignant neoplasm of cervix: Secondary | ICD-10-CM

## 2020-11-05 DIAGNOSIS — Z23 Encounter for immunization: Secondary | ICD-10-CM

## 2020-11-05 DIAGNOSIS — Z6830 Body mass index (BMI) 30.0-30.9, adult: Secondary | ICD-10-CM | POA: Insufficient documentation

## 2020-11-05 DIAGNOSIS — L700 Acne vulgaris: Secondary | ICD-10-CM

## 2020-11-05 DIAGNOSIS — Z309 Encounter for contraceptive management, unspecified: Secondary | ICD-10-CM

## 2020-11-05 DIAGNOSIS — Z113 Encounter for screening for infections with a predominantly sexual mode of transmission: Secondary | ICD-10-CM | POA: Diagnosis not present

## 2020-11-05 NOTE — Progress Notes (Signed)
Subjective: Chief Complaint  Patient presents with   Annual Exam    with pap    Medical team: Kenlei Safi, Camelia Eng, PA-C [facility]   Gyn hx/o - 2 prior pregnancies, 1 live birth, 1 D&C.  No recent sexual activity, uses condoms.   Having ongoing problems with acne, particular under chin.  Sees derm.  Reviewed their medical, surgical, family, social, medication, and allergy history and updated chart as appropriate.  Past Medical History:  Diagnosis Date   Acne    Back pain 2002   went through PT    GERD (gastroesophageal reflux disease)    intermittent   Headache    History of UTI    once prior as of 03/2015   Knee pain 2007   right   Pregnancy induced hypertension    Seizure disorder ([place])    from infancy til age 45yo, on medication during that time, etiology unclear?   Wears glasses     Past Surgical History:  Procedure Laterality Date   CESAREAN SECTION  2006   DILATION AND CURETTAGE OF UTERUS  2013   DILATION AND EVACUATION  12/26/2011   Procedure: DILATATION AND EVACUATION;  Surgeon: Paulo Fruit;  Location: [facility];  Service: Gynecology;  Laterality: N/A;    Social History   Socioeconomic History   Marital status: Single    Spouse name: Not on file   Number of children: Not on file   Years of education: Not on file   Highest education level: Not on file  Occupational History   Not on file  Tobacco Use   Smoking status: Never Smoker   Smokeless tobacco: Never Used  Vaping Use   Vaping Use: Never used  Substance and Sexual Activity   Alcohol use: No   Drug use: No   Sexual activity: Not Currently  Other Topics Concern   Not on file  Social History Narrative   Exercise - prior trainer, using various exercise, HIT.   [employer], swimming.   [employer], lives at home with her son [name] who has Autism;  Working at [employer].  Her sister helps watch [name] in evenings until she is off.   4 siblings, 2 sisters and 1 brother. 10/2020    Social Determinants of Health   Financial Resource Strain:    Difficulty of Paying Living Expenses: Not on file  Food Insecurity:    Worried About Charity fundraiser in the Last Year: Not on file   [employer] of Food in the Last Year: Not on file  Transportation Needs:    Lack of Transportation (Medical): Not on file   Lack of Transportation (Non-Medical): Not on file  Physical Activity:    Days of Exercise per Week: Not on file   Minutes of Exercise per Session: Not on file  Stress:    Feeling of Stress : Not on file  Social Connections:    Frequency of Communication with Friends and Family: Not on file   Frequency of Social Gatherings with Friends and Family: Not on file   Attends Religious Services: Not on file   Active Member of Clubs or Organizations: Not on file   Attends Archivist Meetings: Not on file   Marital Status: Not on file  Intimate Partner Violence:    Fear of Current or Ex-Partner: Not on file   Emotionally Abused: Not on file   Physically Abused: Not on file   Sexually Abused: Not on file    Family  History  Problem Relation Age of Onset   Hypertension Mother    Other Mother        total knee replacement   Arthritis Mother        knee replacement   Diabetes Father    Eczema Sister    Hypertension Maternal Grandmother    Diabetes Maternal Grandmother    Heart disease Maternal Grandmother        CHF   Alzheimer's disease Maternal Grandmother    Cancer Maternal Uncle        lung   Arthritis Cousin        knee replacement   Autism Son    Stroke Neg Hx      Current Outpatient Medications:    ALTRENO 0.05 % LOTN, SMARTSIG:1 Sparingly Topical Every Night, Disp: , Rfl:    hydroquinone 4 % cream, APPLY SMALL AMOUNT TO FACE AT BEDTIME, Disp: , Rfl:    Soap & Cleansers (CERAVE FOAMING FACIAL CLEANSER EX), Apply topically., Disp: , Rfl:    spironolactone (ALDACTONE) 100 MG tablet, Take 100 mg by mouth daily.  , Disp: , Rfl:    phentermine (ADIPEX-P) 37.5 MG tablet, TAKE 1 TABLET BY MOUTH ONCE DAILY BEFORE BREAKFAST (Patient not taking: Reported on 11/05/2020), Disp: 30 tablet, Rfl: 0  Allergies  Allergen Reactions   Cyclinex [Tetracycline Hcl] Swelling    Face, hands, feet    Review of Systems Constitutional: -fever, -chills, -sweats, -unexpected weight change, -decreased appetite, -fatigue Allergy: -sneezing, -itching, -congestion Dermatology: -changing moles, --rash, -lumps ENT: -runny nose, -ear pain, -sore throat, -hoarseness, -sinus pain, -teeth pain, - ringing in ears, -hearing loss, -nosebleeds Cardiology: -chest pain, -palpitations, -swelling, -difficulty breathing when lying flat, -waking up short of breath Respiratory: -cough, -shortness of breath, -difficulty breathing with exercise or exertion, -wheezing, -coughing up blood Gastroenterology: -abdominal pain, -nausea, -vomiting, -diarrhea,-constipation, -blood in stool, -changes in bowel movement, -difficulty swallowing or eating Hematology: -bleeding, -bruising  Musculoskeletal: -joint aches, -muscle aches, -joint swelling, -back pain, -neck pain, -cramping, -changes in gait Ophthalmology: denies vision changes, eye redness, itching, discharge Urology: -burning with urination, -difficulty urinating, -blood in urine, -urinary frequency, -urgency, -incontinence Neurology: -headache, -weakness, -tingling, -numbness, -memory loss, -falls, -dizziness Psychology: -depressed mood, -agitation, -sleep problems     Objective:   Physical Exam  BP 126/80    Pulse 95    Ht 5' 7"  (1.702 m)    Wt 193 lb 6.4 oz (87.7 kg)    SpO2 98%    BMI 30.29 kg/m    Wt Readings from Last 3 Encounters:  11/05/20 193 lb 6.4 oz (87.7 kg)  06/26/20 196 lb 6.4 oz (89.1 kg)  04/13/20 189 lb 3.2 oz (85.8 kg)   General appearance: alert, no distress, WD/WN, AA female Skin: moderate erythematous comedones underneath chin, otherwise few scattered macules,  no  other worrisome lesions HEENT: normocephalic, conjunctiva/corneas normal, sclerae anicteric, PERRLA, EOMi, nares patent, no discharge or erythema, pharynx normal Oral cavity: MMM, tongue normal, teeth normal Neck: supple, no lymphadenopathy, no thyromegaly, no masses, normal ROM, on bruits Chest: non tender, normal shape and expansion Heart: RRR, normal S1, S2, no murmurs Lungs: CTA bilaterally, no wheezes, rhonchi, or rales Abdomen: +bs, soft, non tender, non distended, no masses, no hepatomegaly, no splenomegaly, no bruits Back: non tender, normal ROM, no scoliosis Musculoskeletal: nontender, no swelling, no abnormality of knees or achilles, otherwise extremities non tender, no obvious deformity, normal ROM throughout, lower extremities non tender, no obvious deformity, normal ROM throughout Extremities: no  edema, no cyanosis, no clubbing Pulses: 2+ symmetric, upper and lower extremities, normal cap refill Neurological: alert, oriented x 3, CN2-12 intact, strength normal upper extremities and lower extremities, sensation normal throughout, DTRs 2+ throughout, no cerebellar signs, gait normal Psychiatric: normal affect, behavior normal, pleasant  Breast deferred Pelvic - normal female, normal external genitalia, no mass or lesions, left latearl cervix with mild erythema, no abnormal discharge Anus normal appearing   Assessment and Plan :    Encounter Diagnoses  Name Primary?   Encounter for health maintenance examination in adult Yes   Screening for cervical cancer    Screen for STD (sexually transmitted disease)    Screening for deficiency anemia    Encounter for contraceptive management, unspecified type    Encounter for vitamin deficiency screening    Acne vulgaris    Need for influenza vaccination     Today you had a preventative care visit or wellness visit.    Topics today may have included healthy lifestyle, diet, exercise, preventative care, vaccinations, sick and  well care, proper use of emergency dept and after hours care, as well as other concerns.     Recommendations: Continue to return yearly for your annual wellness and preventative care visits.  This gives [place] a chance to discuss healthy lifestyle, exercise, vaccinations, review your chart record, and perform screenings where appropriate.  I recommend you see your eye doctor yearly for routine vision care.  I recommend you see your dentist yearly for routine dental care including hygiene visits twice yearly.   Vaccination recommendations were reviewed Counseled on the influenza virus vaccine.  Vaccine information sheet given.  Influenza vaccine given after consent obtained.  Declines covid vaccine    Screening for cancer: Breast cancer screening: You should perform a self breast exam monthly.   We reviewed recommendations for regular mammograms and breast cancer screening.  Colon cancer screening:  Age 62yo  Cervical cancer screening: We reviewed recommendations for pap smear screening.  Skin cancer screening: Check your skin regularly for new changes, growing lesions, or other lesions of concern Come in for evaluation if you have skin lesions of concern.  Lung cancer screening: If you have a greater than 30 pack year history of tobacco use, then you qualify for lung cancer screening with a chest CT scan  We currently don't have screenings for other cancers besides breast, cervical, colon, and lung cancers.  If you have a strong family history of cancer or have other cancer screening concerns, please let me know.    Bone health: Get at least 150 minutes of aerobic exercise weekly Get weight bearing exercise at least once weekly   Heart health: Get at least 150 minutes of aerobic exercise weekly Limit alcohol It is important to maintain a healthy blood pressure and healthy cholesterol numbers   Screening for sexually transmitted infections: We discussed testing,  prevention, and means of transmission    Separate significant issues discussed: Acne - some of her acne issues are hormonal.   Consider trying hormonal birth control again.   F/u with dermatology as planned, c/t other therapies as usual  BMI 30 - c/t efforts with exercise, healthy diet, weight loss            Dawn Goodwin was seen today for annual exam.  Diagnoses and all orders for this visit:  Encounter for health maintenance examination in adult -     Comprehensive metabolic panel -     CBC with Differential/Platelet -  HIV Antibody (routine testing w rflx) -     RPR -     Hepatitis C antibody -     Hepatitis B surface antigen -     VITAMIN D 25 Hydroxy (Vit-D Deficiency, Fractures) -     Iron -     Cytology - PAP([place])  Screening for cervical cancer -     Cytology - PAP([place])  Screen for STD (sexually transmitted disease) -     HIV Antibody (routine testing w rflx) -     RPR -     Hepatitis C antibody -     Hepatitis B surface antigen -     Cytology - PAP([place])  Screening for deficiency anemia -     CBC with Differential/Platelet -     Iron  Encounter for contraceptive management, unspecified type  Encounter for vitamin deficiency screening -     VITAMIN D 25 Hydroxy (Vit-D Deficiency, Fractures)  Acne vulgaris  Need for influenza vaccination       F/u pending labs

## 2020-11-06 ENCOUNTER — Other Ambulatory Visit: Payer: Self-pay | Admitting: Medical

## 2020-11-06 LAB — CBC WITH DIFFERENTIAL/PLATELET
Basophils Absolute: 0.1 10*3/uL (ref 0.0–0.2)
Basos: 1 %
EOS (ABSOLUTE): 0.2 10*3/uL (ref 0.0–0.4)
Eos: 2 %
Hematocrit: 37.3 % (ref 34.0–46.6)
Hemoglobin: 11.7 g/dL (ref 11.1–15.9)
Immature Grans (Abs): 0 10*3/uL (ref 0.0–0.1)
Immature Granulocytes: 0 %
Lymphocytes Absolute: 2.3 10*3/uL (ref 0.7–3.1)
Lymphs: 28 %
MCH: 28.1 pg (ref 26.6–33.0)
MCHC: 31.4 g/dL — ABNORMAL LOW (ref 31.5–35.7)
MCV: 89 fL (ref 79–97)
Monocytes Absolute: 0.7 10*3/uL (ref 0.1–0.9)
Monocytes: 9 %
Neutrophils Absolute: 4.9 10*3/uL (ref 1.4–7.0)
Neutrophils: 60 %
Platelets: 245 10*3/uL (ref 150–450)
RBC: 4.17 x10E6/uL (ref 3.77–5.28)
RDW: 12.8 % (ref 11.7–15.4)
WBC: 8.2 10*3/uL (ref 3.4–10.8)

## 2020-11-06 LAB — COMPREHENSIVE METABOLIC PANEL
ALT: 19 IU/L (ref 0–32)
AST: 17 IU/L (ref 0–40)
Albumin/Globulin Ratio: 1.3 (ref 1.2–2.2)
Albumin: 4 g/dL (ref 3.8–4.8)
Alkaline Phosphatase: 87 IU/L (ref 44–121)
BUN/Creatinine Ratio: 17 (ref 9–23)
BUN: 14 mg/dL (ref 6–24)
Bilirubin Total: 0.3 mg/dL (ref 0.0–1.2)
CO2: 25 mmol/L (ref 20–29)
Calcium: 9.2 mg/dL (ref 8.7–10.2)
Chloride: 105 mmol/L (ref 96–106)
Creatinine, Ser: 0.83 mg/dL (ref 0.57–1.00)
GFR calc Af Amer: 99 mL/min/{1.73_m2} (ref >59)
GFR calc non Af Amer: 86 mL/min/{1.73_m2} (ref >59)
Globulin, Total: 3.2 g/dL (ref 1.5–4.5)
Glucose: 86 mg/dL (ref 65–99)
Potassium: 4.6 mmol/L (ref 3.5–5.2)
Sodium: 141 mmol/L (ref 134–144)
Total Protein: 7.2 g/dL (ref 6.0–8.5)

## 2020-11-06 LAB — HEPATITIS B SURFACE ANTIGEN: Hepatitis B Surface Ag: NEGATIVE

## 2020-11-06 LAB — HEPATITIS C ANTIBODY: Hep C Virus Ab: <0.1 s/co ratio (ref 0.0–0.9)

## 2020-11-06 LAB — IRON: Iron: 151 ug/dL (ref 27–159)

## 2020-11-06 LAB — RPR: RPR Ser Ql: NONREACTIVE

## 2020-11-06 LAB — HIV ANTIBODY (ROUTINE TESTING W REFLEX): HIV Screen 4th Generation wRfx: NONREACTIVE

## 2020-11-06 LAB — VITAMIN D 25 HYDROXY (VIT D DEFICIENCY, FRACTURES): Vit D, 25-Hydroxy: 38.1 ng/mL (ref 30.0–100.0)

## 2020-11-06 MED ORDER — ETONOGESTREL-ETHINYL ESTRADIOL 0.12-0.015 MG/24HR VA RING: 1.0000 | VAGINAL_RING | VAGINAL | 11 refills | Status: DC

## 2020-11-09 LAB — CYTOLOGY - PAP
Chlamydia: NEGATIVE
Comment: NEGATIVE
Comment: NEGATIVE
Comment: NORMAL
Diagnosis: NEGATIVE
High risk HPV: NEGATIVE
Neisseria Gonorrhea: NEGATIVE

## 2021-01-08 DIAGNOSIS — L811 Chloasma: Secondary | ICD-10-CM | POA: Diagnosis not present

## 2021-01-08 DIAGNOSIS — L281 Prurigo nodularis: Secondary | ICD-10-CM | POA: Diagnosis not present

## 2021-01-08 DIAGNOSIS — L731 Pseudofolliculitis barbae: Secondary | ICD-10-CM | POA: Diagnosis not present

## 2021-01-08 DIAGNOSIS — L700 Acne vulgaris: Secondary | ICD-10-CM | POA: Diagnosis not present

## 2021-01-25 DIAGNOSIS — H5213 Myopia, bilateral: Secondary | ICD-10-CM | POA: Diagnosis not present

## 2021-02-09 DIAGNOSIS — H5213 Myopia, bilateral: Secondary | ICD-10-CM | POA: Diagnosis not present

## 2021-03-14 ENCOUNTER — Other Ambulatory Visit: Payer: Self-pay | Admitting: Medical

## 2021-03-16 ENCOUNTER — Telehealth: Payer: Self-pay | Admitting: Medical

## 2021-03-16 NOTE — Telephone Encounter (Signed)
Got pt scheduled for 4/4

## 2021-03-16 NOTE — Telephone Encounter (Signed)
Schedule 1 mo f/u on weight loss efforts.  I sent refill on phentermine

## 2021-03-22 ENCOUNTER — Ambulatory Visit (INDEPENDENT_AMBULATORY_CARE_PROVIDER_SITE_OTHER): Payer: Managed Care, Other (non HMO) | Admitting: Medical

## 2021-03-22 ENCOUNTER — Encounter: Payer: Self-pay | Admitting: Medical

## 2021-03-22 ENCOUNTER — Other Ambulatory Visit: Payer: Self-pay

## 2021-03-22 VITALS — BP 130/82 | HR 98 | Ht 67.0 in | Wt 206.6 lb

## 2021-03-22 DIAGNOSIS — Z6832 Body mass index (BMI) 32.0-32.9, adult: Secondary | ICD-10-CM

## 2021-03-22 DIAGNOSIS — M79645 Pain in left finger(s): Secondary | ICD-10-CM | POA: Diagnosis not present

## 2021-03-22 DIAGNOSIS — M545 Low back pain, unspecified: Secondary | ICD-10-CM

## 2021-03-22 DIAGNOSIS — G8929 Other chronic pain: Secondary | ICD-10-CM

## 2021-03-22 MED ORDER — METHOCARBAMOL 500 MG PO TABS: 500.0000 mg | ORAL_TABLET | Freq: Every day | ORAL | 0 refills | Status: DC | PRN

## 2021-03-22 MED ORDER — SAXENDA 18 MG/3ML ~~LOC~~ SOPN: 3.0000 mg | PEN_INJECTOR | Freq: Every day | SUBCUTANEOUS | 1 refills | Status: DC

## 2021-03-22 NOTE — Progress Notes (Signed)
Subjective:  Dawn Goodwin is a 45 y.o. female who presents for Chief Complaint  Patient presents with  . Weight Check    Pt present to follow up on weight. Discuss lower back pain and left thumb pain      Here for f/u on weight loss efforts and phentermine.    Exercise - was working with a trainer regularly 2020, May - November.   currently exercising on her own, 3 days per week.  At work uses stairs.  is active, exercising regularly.  Diet - eating habits ok.  Getting extra fiber, doing vegetables, cutting out meat here nad there.     She has continued Phentermine every other day x2 separate prescriptions in the recent months, but wants to try something else.   Working 3rd shift.     She also notes some low back pain.  Particularly if doing some of the exercises that the trainer gave her she gets back pain.  No pain radiating down the legs, no numbness tingling or weakness in the legs.  No saddle anesthesia.  No incontinence.  No fever.  No recent injury or trauma or fall.  Certain exercises like planks with core twist really aggravate the back  She also notes some pain in her left thumb times a few weeks.  No injury no trauma.  No other aggravating or relieving factors.    No other c/o.  The following portions of the patient's history were reviewed and updated as appropriate: allergies, current medications, past family history, past medical history, past social history, past surgical history and problem list.  ROS Otherwise as in subjective above    Objective: BP 130/82   Pulse 98   Ht 5' 7"  (1.702 m)   Wt 206 lb 9.6 oz (93.7 kg)   SpO2 98%   BMI 32.36 kg/m    Wt Readings from Last 3 Encounters:  03/22/21 206 lb 9.6 oz (93.7 kg)  11/05/20 193 lb 6.4 oz (87.7 kg)  06/26/20 196 lb 6.4 oz (89.1 kg)   General appearance: alert, no distress, well developed, well nourished Back nontender to palpation, no obvious scoliosis, full range of motion but does get some pain  with extension and flexion of back.   Legs nontender, no swelling, no deformity, normal range of motion of joints.   Pulses: 2+ radial pulses, 2+ pedal pulses, normal cap refill Ext: no edema   Assessment: Encounter Diagnoses  Name Primary?  . Chronic bilateral low back pain without sciatica Yes  . BMI 32.0-32.9,adult   . Pain of left thumb      Plan: Chronic back pain-I reviewed her 2020 lumbar x-ray that showed some endplate spurring but otherwise fairly unremarkable.  We discussed good form when exercising, slow movements not rushed or rocking into a motion such as crunches.  We discussed avoiding particular exercise now that aggravate her pains.  For example discussed doing a basic crunch versus chronic to legs up and near.  Not doing planks plus core twist at the same time, just basic plank or basic sit up.  If this continues to be a problem we can refer back to orthopedic that she saw back in 2020.  Left thumb pain-exam unremarkable.  Offered x-ray.  She will consider.  For thumb and back can use occasional NSAIDs such as Aleve.  Gave option for muscle x-ray as needed for back.  BMI 32-actually gained weight recently.  Stop phentermine.  Discussed other options for care.  She wants  to try Saxenda.  Gave first injection today.  Discussed proper use of medicine.  Continue efforts with healthy eating, regular exercise.  F/u 6- 8 weeks.  Dawn Goodwin was seen today for weight check.  Diagnoses and all orders for this visit:  Chronic bilateral low back pain without sciatica  BMI 32.0-32.9,adult  Pain of left thumb  Other orders -     Liraglutide -Weight Management (SAXENDA) 18 MG/3ML SOPN; Inject 3 mg into the skin daily. Start 0.574m daily x 1 week, then increase to 1.276mdaily x 1wk, then 1.74m57maily x 1 wk, then 3mg54mily -     methocarbamol (ROBAXIN) 500 MG tablet; Take 1 tablet (500 mg total) by mouth daily as needed for muscle spasms.    Follow up: 6- 8 weeks

## 2021-04-10 ENCOUNTER — Telehealth: Payer: Self-pay

## 2021-04-10 NOTE — Telephone Encounter (Signed)
P.A. SAXENDA

## 2021-04-17 NOTE — Telephone Encounter (Signed)
P.A. approved til 09/22/21

## 2021-04-29 ENCOUNTER — Telehealth: Payer: Self-pay

## 2021-04-29 NOTE — Telephone Encounter (Signed)
Pt. Called stating she wanted you to call her back she just had a few questions about her weight loss injection medicine. She stopped taking it because she said it wasn't really working and wanted to know if there would be any side effects of her just stopping her medication.

## 2021-04-29 NOTE — Telephone Encounter (Signed)
I believe she is referring to [place].  Dawn Goodwin is started at the lower dose 0.52m daily x 1 week.  Then is slowly increased each step to 1.240mfor a week or 2, then 1.38m438mor a week or 2, titrating up to 3mg51m  Each milligram dose has a marking on the pen.  Normally people stay at the 1.2 or 1.6 mg for a little while due to potential for nausea.  But if that does not occur in the first few weeks and going up to 3 mg daily.  Nausea is the most common side effect other than possible redness or irritation at the injection site.  So she does not feel it is helping she probably needs to go up on the dose

## 2021-04-30 NOTE — Telephone Encounter (Signed)
Go ahead and go over the recommendations in this message but yes she needs to follow-up within a month

## 2021-04-30 NOTE — Telephone Encounter (Signed)
Does patient need to schedule a follow up for dose adjustment?

## 2021-05-03 NOTE — Telephone Encounter (Signed)
Patient wants to try something different. She doesn't want the injection anymore. Is there something else you recommend?

## 2021-05-04 NOTE — Telephone Encounter (Signed)
Other options include Qsymia which I think she has tried, other injections like saxenda.  I recommend maybe going a different direction.  I recommend trial of Wellbutrin which can help with reducing cravings for example.   If agreeable I'll send this to pharmacy to begin once daily  If not agreeable, we could refer to weight management clinic

## 2021-05-05 NOTE — Telephone Encounter (Signed)
Lmom for patient to call and discuss other weight loss options.

## 2021-05-07 NOTE — Telephone Encounter (Signed)
Pt no longer taking.

## 2021-05-10 ENCOUNTER — Telehealth: Payer: Self-pay | Admitting: Medical

## 2021-05-10 ENCOUNTER — Other Ambulatory Visit: Payer: Self-pay | Admitting: Medical

## 2021-05-10 MED ORDER — BUPROPION HCL ER (XL) 150 MG PO TB24: 150.0000 mg | ORAL_TABLET | ORAL | 0 refills | Status: DC

## 2021-05-10 NOTE — Telephone Encounter (Signed)
Patient would like to try Wellbutrin. Please send to the pharmacy.

## 2021-05-10 NOTE — Telephone Encounter (Signed)
Wellbutrin sent, make a follow-up appointment within the next 30 days

## 2021-05-10 NOTE — Telephone Encounter (Signed)
Got pt scheduled

## 2021-05-10 NOTE — Telephone Encounter (Signed)
error 

## 2021-06-28 ENCOUNTER — Ambulatory Visit: Payer: Managed Care, Other (non HMO) | Admitting: Medical

## 2021-09-04 ENCOUNTER — Telehealth: Payer: Self-pay

## 2021-09-04 NOTE — Telephone Encounter (Signed)
Received renewal for P.A. SAXENDA  this was D/C

## 2021-10-25 ENCOUNTER — Other Ambulatory Visit: Payer: Self-pay | Admitting: Medical

## 2021-10-25 DIAGNOSIS — Z1231 Encounter for screening mammogram for malignant neoplasm of breast: Secondary | ICD-10-CM

## 2021-11-23 ENCOUNTER — Ambulatory Visit (INDEPENDENT_AMBULATORY_CARE_PROVIDER_SITE_OTHER): Payer: Managed Care, Other (non HMO) | Admitting: Medical

## 2021-11-23 ENCOUNTER — Encounter: Payer: Self-pay | Admitting: Medical

## 2021-11-23 ENCOUNTER — Other Ambulatory Visit: Payer: Self-pay

## 2021-11-23 VITALS — BP 110/70 | HR 96 | Ht 66.5 in | Wt 219.2 lb

## 2021-11-23 DIAGNOSIS — Z131 Encounter for screening for diabetes mellitus: Secondary | ICD-10-CM

## 2021-11-23 DIAGNOSIS — M545 Low back pain, unspecified: Secondary | ICD-10-CM

## 2021-11-23 DIAGNOSIS — Z0000 Encounter for general adult medical examination without abnormal findings: Secondary | ICD-10-CM | POA: Diagnosis not present

## 2021-11-23 DIAGNOSIS — Z1211 Encounter for screening for malignant neoplasm of colon: Secondary | ICD-10-CM | POA: Insufficient documentation

## 2021-11-23 DIAGNOSIS — N926 Irregular menstruation, unspecified: Secondary | ICD-10-CM

## 2021-11-23 DIAGNOSIS — Z1231 Encounter for screening mammogram for malignant neoplasm of breast: Secondary | ICD-10-CM

## 2021-11-23 DIAGNOSIS — Z113 Encounter for screening for infections with a predominantly sexual mode of transmission: Secondary | ICD-10-CM | POA: Diagnosis not present

## 2021-11-23 DIAGNOSIS — Z6834 Body mass index (BMI) 34.0-34.9, adult: Secondary | ICD-10-CM

## 2021-11-23 DIAGNOSIS — Z130 Encounter for screening for diseases of the blood and blood-forming organs and certain disorders involving the immune mechanism: Secondary | ICD-10-CM

## 2021-11-23 DIAGNOSIS — G8929 Other chronic pain: Secondary | ICD-10-CM

## 2021-11-23 DIAGNOSIS — N920 Excessive and frequent menstruation with regular cycle: Secondary | ICD-10-CM

## 2021-11-23 NOTE — Progress Notes (Signed)
Subjective: Chief Complaint  Patient presents with   nonfasting cpe    Nonfasting cpe, declines flu shot today,  last pap 2021 but has new obgyn-     Medical team: [facility]  Concerns: She got off track with her exercise program.  She has gained weight this past year.  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  No recent sexual activity, uses condoms.   She is nonfasting today  She missed a period in November but then started back today.  Her periods have been heavy in general but regular  Reviewed their medical, surgical, family, social, medication, and allergy history and updated chart as appropriate.  Past Medical History:  Diagnosis Date   Acne    Back pain 2002   went through PT    GERD (gastroesophageal reflux disease)    intermittent   Headache    History of UTI    once prior as of 03/2015   Knee pain 2007   right   Pregnancy induced hypertension    Seizure disorder ([place])    from infancy til age 76yo, on medication during that time, etiology unclear?   Wears glasses     Past Surgical History:  Procedure Laterality Date   CESAREAN SECTION  2006   DILATION AND CURETTAGE OF UTERUS  2013   DILATION AND EVACUATION  12/26/2011   Procedure: DILATATION AND EVACUATION;  Surgeon: Paulo Fruit;  Location: [facility];  Service: Gynecology;  Laterality: N/A;    Social History   Socioeconomic History   Marital status: Single    Spouse name: Not on file   Number of children: Not on file   Years of education: Not on file   Highest education level: Not on file  Occupational History   Not on file  Tobacco Use   Smoking status: Never   Smokeless tobacco: Never  Vaping Use   Vaping Use: Never used  Substance and Sexual Activity   Alcohol use: No   Drug use: No   Sexual activity: Not Currently  Other Topics Concern   Not on file  Social History Narrative   Exercise  - prior trainer, using various exercise, HIT.   [employer], swimming.   [employer], lives at home with her son [name] who has Autism;  Working at [employer].  Her sister helps watch [name] in evenings until she is off.   4 siblings, 2 sisters and 1 brother. 10/2020   Social Determinants of Health   Financial Resource Strain: Not on file  Food Insecurity: Not on file  Transportation Needs: Not on file  Physical Activity: Not on file  Stress: Not on file  Social Connections: Not on file  Intimate Partner Violence: Not on file    Family History  Problem Relation Age of Onset   Hypertension Mother    Other Mother        total knee replacement   Arthritis Mother        knee replacement   Diabetes Father    Eczema Sister    Hypertension Maternal Grandmother    Diabetes Maternal Grandmother    Heart disease Maternal Grandmother        CHF   Alzheimer's disease Maternal Grandmother    Cancer Maternal Uncle        lung   Arthritis Cousin        knee  replacement   Autism Son    Stroke Neg Hx     No current outpatient medications on file.  Allergies  Allergen Reactions   Cyclinex [Tetracycline Hcl] Swelling    Face, hands, feet    Review of Systems Constitutional: -fever, -chills, -sweats, -unexpected weight change, -decreased appetite, -fatigue Allergy: -sneezing, -itching, -congestion Dermatology: -changing moles, --rash, -lumps ENT: -runny nose, -ear pain, -sore throat, -hoarseness, -sinus pain, -teeth pain, - ringing in ears, -hearing loss, -nosebleeds Cardiology: -chest pain, -palpitations, -swelling, -difficulty breathing when lying flat, -waking up short of breath Respiratory: -cough, -shortness of breath, -difficulty breathing with exercise or exertion, -wheezing, -coughing up blood Gastroenterology: -abdominal pain, -nausea, -vomiting, -diarrhea,-constipation, -blood in stool, -changes in bowel movement, -difficulty swallowing or eating Hematology: -bleeding, -bruising   Musculoskeletal: -joint aches, -muscle aches, -joint swelling, -back pain, -neck pain, -cramping, -changes in gait Ophthalmology: denies vision changes, eye redness, itching, discharge Urology: -burning with urination, -difficulty urinating, -blood in urine, -urinary frequency, -urgency, -incontinence Neurology: -headache, -weakness, -tingling, -numbness, -memory loss, -falls, -dizziness Psychology: -depressed mood, -agitation, -sleep problems     Objective:   Physical Exam  BP 110/70   Pulse 96   Ht 5' 6.5" (1.689 m)   Wt 219 lb 3.2 oz (99.4 kg)   LMP 11/21/2021   BMI 34.85 kg/m    Wt Readings from Last 3 Encounters:  11/23/21 219 lb 3.2 oz (99.4 kg)  03/22/21 206 lb 9.6 oz (93.7 kg)  11/05/20 193 lb 6.4 oz (87.7 kg)   General appearance: alert, no distress, WD/WN, AA female Skin: moderate erythematous comedones underneath chin, otherwise few scattered macules,  no other worrisome lesions HEENT: normocephalic, conjunctiva/corneas normal, sclerae anicteric, PERRLA, EOMi Neck: supple, no lymphadenopathy, no thyromegaly, no masses, normal ROM, on bruits Chest: non tender, normal shape and expansion Heart: RRR, normal S1, S2, no murmurs Lungs: CTA bilaterally, no wheezes, rhonchi, or rales Abdomen: +bs, soft, non tender, non distended, no masses, no hepatomegaly, no splenomegaly, no bruits Back: non tender, normal ROM, no scoliosis Musculoskeletal: nontender, no swelling, no abnormality of knees or achilles, otherwise extremities non tender, no obvious deformity, normal ROM throughout, lower extremities non tender, no obvious deformity, normal ROM throughout Extremities: no edema, no cyanosis, no clubbing Pulses: 2+ symmetric, upper and lower extremities, normal cap refill Neurological: alert, oriented x 3, CN2-12 intact, strength normal upper extremities and lower extremities, sensation normal throughout, DTRs 2+ throughout, no cerebellar signs, gait normal Psychiatric: normal  affect, behavior normal, pleasant  Breast, pelvic, rectal-deferred   Assessment and Plan :    Encounter Diagnoses  Name Primary?   Encounter for health maintenance examination in adult Yes   Screening for deficiency anemia    Screen for STD (sexually transmitted disease)    Chronic bilateral low back pain without sciatica    BMI 34.0-34.9,adult    Screen for colon cancer    Encounter for screening mammogram for malignant neoplasm of breast    Missed period    Menorrhagia with regular cycle    Screening for diabetes mellitus      Today you had a preventative care visit or wellness visit.    Topics today may have included healthy lifestyle, diet, exercise, preventative care, vaccinations, sick and well care, proper use of emergency dept and after hours care, as well as other concerns.     Recommendations: Continue to return yearly for your annual wellness and preventative care visits.  This gives [place] a chance to discuss healthy lifestyle, exercise, vaccinations,  review your chart record, and perform screenings where appropriate.  I recommend you see your eye doctor yearly for routine vision care.  I recommend you see your dentist yearly for routine dental care including hygiene visits twice yearly.   Vaccination recommendations were reviewed Immunization History  Administered Date(s) Administered   Hepatitis B, adult 08/05/2016, 10/05/2016, 02/06/2017   Influenza Split 09/22/2011, 08/07/2014   Influenza,inj,Quad PF,6+ Mos 12/23/2015, 08/25/2016, 10/03/2017, 10/05/2018, 10/08/2019   PPD Test 08/01/2016   Rho (D) Immune Globulin 12/25/2011   Tdap 04/15/2015    She declines flu vaccine today  Screening for cancer: Breast cancer screening: You should perform a self breast exam monthly.   Follow-up as scheduled for mammogram this month  Colon cancer screening:  We discussed options for screening.  She would like to move forward with the Cologuard test  Cervical cancer  screening: We reviewed recommendations for pap smear screening. Pap smear up-to-date 2021 reviewed  Skin cancer screening: Check your skin regularly for new changes, growing lesions, or other lesions of concern Come in for evaluation if you have skin lesions of concern.  Lung cancer screening: If you have a greater than 30 pack year history of tobacco use, then you qualify for lung cancer screening with a chest CT scan  We currently don't have screenings for other cancers besides breast, cervical, colon, and lung cancers.  If you have a strong family history of cancer or have other cancer screening concerns, please let me know.    Bone health: Get at least 150 minutes of aerobic exercise weekly Get weight bearing exercise at least once weekly   Heart health: Get at least 150 minutes of aerobic exercise weekly Limit alcohol It is important to maintain a healthy blood pressure and healthy cholesterol numbers   Screening for sexually transmitted infections: We discussed testing, prevention, and means of transmission    Separate significant issues discussed: BMI greater than 34-unfortunately she gained weight this year.  I advise she get back on her exercise program like she was doing prior.  She was doing such a great job a year ago with this.  Heavy periods, missed peroid last month -Return for labs on Friday as planned   Miquela was seen today for nonfasting cpe.  Diagnoses and all orders for this visit:  Encounter for health maintenance examination in adult -     RPR+HIV+GC+CT Panel; Future -     Hepatitis B surface antigen; Future -     Hepatitis C antibody; Future -     CBC with Differential/Platelet; Future -     Comprehensive metabolic panel; Future -     TSH; Future -     Lipid panel; Future -     POCT urine pregnancy; Future -     Hemoglobin A1c; Future -     Cologuard  Screening for deficiency anemia  Screen for STD (sexually transmitted disease) -      RPR+HIV+GC+CT Panel; Future -     Hepatitis B surface antigen; Future -     Hepatitis C antibody; Future  Chronic bilateral low back pain without sciatica  BMI 34.0-34.9,adult  Screen for colon cancer -     Cologuard  Encounter for screening mammogram for malignant neoplasm of breast  Missed period -     POCT urine pregnancy; Future  Menorrhagia with regular cycle -     POCT urine pregnancy; Future  Screening for diabetes mellitus -     Hemoglobin A1c; Future  F/u this week for fasting labs

## 2021-11-26 ENCOUNTER — Other Ambulatory Visit (INDEPENDENT_AMBULATORY_CARE_PROVIDER_SITE_OTHER): Payer: Managed Care, Other (non HMO)

## 2021-11-26 ENCOUNTER — Other Ambulatory Visit: Payer: Self-pay

## 2021-11-26 DIAGNOSIS — Z113 Encounter for screening for infections with a predominantly sexual mode of transmission: Secondary | ICD-10-CM

## 2021-11-26 DIAGNOSIS — N926 Irregular menstruation, unspecified: Secondary | ICD-10-CM

## 2021-11-26 DIAGNOSIS — Z0000 Encounter for general adult medical examination without abnormal findings: Secondary | ICD-10-CM

## 2021-11-26 DIAGNOSIS — Z131 Encounter for screening for diabetes mellitus: Secondary | ICD-10-CM

## 2021-11-26 DIAGNOSIS — N920 Excessive and frequent menstruation with regular cycle: Secondary | ICD-10-CM

## 2021-11-26 DIAGNOSIS — Z23 Encounter for immunization: Secondary | ICD-10-CM

## 2021-11-26 LAB — POCT URINE PREGNANCY: Preg Test, Ur: NEGATIVE

## 2021-11-28 LAB — COMPREHENSIVE METABOLIC PANEL
ALT: 14 IU/L (ref 0–32)
AST: 20 IU/L (ref 0–40)
Albumin/Globulin Ratio: 1.4 (ref 1.2–2.2)
Albumin: 4 g/dL (ref 3.8–4.8)
Alkaline Phosphatase: 104 IU/L (ref 44–121)
BUN/Creatinine Ratio: 13 (ref 9–23)
BUN: 10 mg/dL (ref 6–24)
Bilirubin Total: 0.6 mg/dL (ref 0.0–1.2)
CO2: 23 mmol/L (ref 20–29)
Calcium: 8.8 mg/dL (ref 8.7–10.2)
Chloride: 103 mmol/L (ref 96–106)
Creatinine, Ser: 0.76 mg/dL (ref 0.57–1.00)
Globulin, Total: 2.8 g/dL (ref 1.5–4.5)
Glucose: 72 mg/dL (ref 70–99)
Potassium: 3.9 mmol/L (ref 3.5–5.2)
Sodium: 141 mmol/L (ref 134–144)
Total Protein: 6.8 g/dL (ref 6.0–8.5)
eGFR: 98 mL/min/{1.73_m2} (ref >59)

## 2021-11-28 LAB — CBC WITH DIFFERENTIAL/PLATELET
Basophils Absolute: 0.1 10*3/uL (ref 0.0–0.2)
Basos: 1 %
EOS (ABSOLUTE): 0.3 10*3/uL (ref 0.0–0.4)
Eos: 3 %
Hematocrit: 38.4 % (ref 34.0–46.6)
Hemoglobin: 12.4 g/dL (ref 11.1–15.9)
Immature Grans (Abs): 0 10*3/uL (ref 0.0–0.1)
Immature Granulocytes: 0 %
Lymphocytes Absolute: 2 10*3/uL (ref 0.7–3.1)
Lymphs: 22 %
MCH: 29.7 pg (ref 26.6–33.0)
MCHC: 32.3 g/dL (ref 31.5–35.7)
MCV: 92 fL (ref 79–97)
Monocytes Absolute: 0.8 10*3/uL (ref 0.1–0.9)
Monocytes: 8 %
Neutrophils Absolute: 6.1 10*3/uL (ref 1.4–7.0)
Neutrophils: 66 %
Platelets: 266 10*3/uL (ref 150–450)
RBC: 4.18 x10E6/uL (ref 3.77–5.28)
RDW: 12.6 % (ref 11.7–15.4)
WBC: 9.3 10*3/uL (ref 3.4–10.8)

## 2021-11-28 LAB — RPR+HIV+GC+CT PANEL
Chlamydia trachomatis, NAA: NEGATIVE
HIV Screen 4th Generation wRfx: NONREACTIVE
Neisseria Gonorrhoeae by PCR: NEGATIVE
RPR Ser Ql: NONREACTIVE

## 2021-11-28 LAB — LIPID PANEL
Chol/HDL Ratio: 2.9 ratio (ref 0.0–4.4)
Cholesterol, Total: 136 mg/dL (ref 100–199)
HDL: 47 mg/dL (ref >39)
LDL Chol Calc (NIH): 80 mg/dL (ref 0–99)
Triglycerides: 39 mg/dL (ref 0–149)
VLDL Cholesterol Cal: 9 mg/dL (ref 5–40)

## 2021-11-28 LAB — TSH: TSH: 0.784 u[IU]/mL (ref 0.450–4.500)

## 2021-11-28 LAB — HEMOGLOBIN A1C
Est. average glucose Bld gHb Est-mCnc: 100 mg/dL
Hgb A1c MFr Bld: 5.1 % (ref 4.8–5.6)

## 2021-11-28 LAB — HEPATITIS B SURFACE ANTIGEN: Hepatitis B Surface Ag: NEGATIVE

## 2021-11-28 LAB — HEPATITIS C ANTIBODY: Hep C Virus Ab: 0.1 s/co ratio (ref 0.0–0.9)

## 2021-11-29 ENCOUNTER — Ambulatory Visit: Payer: Managed Care, Other (non HMO)

## 2021-11-30 ENCOUNTER — Encounter: Payer: Self-pay | Admitting: Internal Medicine

## 2021-11-30 ENCOUNTER — Telehealth: Payer: Self-pay | Admitting: Internal Medicine

## 2021-11-30 NOTE — Telephone Encounter (Signed)
Patient has sent me work forms to be out of work due covid. Advised patient she needs an appt to discuss covid and discuss forms

## 2021-11-30 NOTE — Telephone Encounter (Signed)
Pt and son both have covid. Do you recommend anything otc for congestion and cough

## 2021-11-30 NOTE — Telephone Encounter (Signed)
Patient called back and states that she only needs a note stating she was positive for covid- per patient no forms needed to be filled out.  [name] came home sick Friday from school and Friday night was positive. Quinnlyn woke up Saturday sick but did not test positive until Monday morning and was positive.   Per shane ok for note stating that she was positive since she sent me the results. I will fax to job [employer]  Leave ID reference number - [id number]

## 2021-12-06 ENCOUNTER — Telehealth: Payer: Self-pay | Admitting: Internal Medicine

## 2021-12-06 NOTE — Telephone Encounter (Signed)
Per patient she spoke with a representative today 12/06/2021 and she was saying it's a new policy that the 3 page paperwork has to be filled out. she also extended a day or so because I'm still not feeling well and still have a cough and congestion. Also, the days they added before was the days my son was out for COVID and I missed days from work because of [name] being out then I was positive and was out.    So she does need FMLA forms filled out due to covid

## 2021-12-07 NOTE — Telephone Encounter (Signed)
Spoke with pt and got her scheduled for virtual on Thursday. She has returned to work but not sure when. Forms will be in brown folder up front

## 2021-12-09 ENCOUNTER — Other Ambulatory Visit: Payer: Self-pay

## 2021-12-09 ENCOUNTER — Telehealth (INDEPENDENT_AMBULATORY_CARE_PROVIDER_SITE_OTHER): Payer: Managed Care, Other (non HMO) | Admitting: Medical

## 2021-12-09 VITALS — Wt 216.0 lb

## 2021-12-09 DIAGNOSIS — Z8616 Personal history of COVID-19: Secondary | ICD-10-CM

## 2021-12-09 DIAGNOSIS — J069 Acute upper respiratory infection, unspecified: Secondary | ICD-10-CM | POA: Diagnosis not present

## 2021-12-09 DIAGNOSIS — Z0289 Encounter for other administrative examinations: Secondary | ICD-10-CM

## 2021-12-09 MED ORDER — PROMETHAZINE-DM 6.25-15 MG/5ML PO SYRP: 5.0000 mL | ORAL_SOLUTION | Freq: Four times a day (QID) | ORAL | 0 refills | Status: DC | PRN

## 2021-12-09 NOTE — Progress Notes (Signed)
°  Subjective:     Patient ID: Dawn Goodwin, female   DOB: 01-06-1976, 45 y.o.   MRN: [id number]  This visit type was conducted due to national recommendations for restrictions regarding the COVID-19 Pandemic (e.g. social distancing) in an effort to limit this patient's exposure and mitigate transmission in our community.  Due to their co-morbid illnesses, this patient is at least at moderate risk for complications without adequate follow up.  This format is felt to be most appropriate for this patient at this time.    Documentation for virtual audio and video telecommunications through [place] encounter:  The patient was located at home. The provider was located in the office. The patient did consent to this visit and is aware of possible charges through their insurance for this visit.  The other persons participating in this telemedicine service were none. Time spent on call was 20 minutes and in review of previous records 20 minutes total.  This virtual service is not related to other E/M service within previous 7 days.   HPI Chief Complaint  Patient presents with   needs form completed    Covid positive but son was sick starting 11/22/21, tested positive on 11/26/21 and tested positive herself on 11/29/21. Sent back to work on 12/07/21 and needs forms completed   Virtual consult to discuss forms we received in the office about her absence.  She missed work recently due to son being sick with COVID and she was positive herself, tested + November 29, 2021.  She notes having symptoms from 11/29/21 for about a week.  Overall improved but still has some cough.   At the time she had cough, decreased appetite, congestion, headache, fever.  Currently has cough, intermittent.   No current SOB, no wheezing, no sore throat.  No body aches or chills.     Missed work from 11/26/21 - 12/07/21.   She went to back to work on December 07, 2021.  No other aggravating or relieving factors. No other  complaint.   Review of Systems As in subjective     Objective:   Physical Exam  Due to coronavirus pandemic stay at home measures, patient visit was virtual and they were not examined in person.    Wt 216 lb (98 kg)    LMP 11/21/2021    BMI 34.34 kg/m   Gen: wd, wn, nad      Assessment:     Encounter Diagnoses  Name Primary?   History of COVID-19 Yes   Encounter for completion of form with patient    Recent URI        Plan:     We discussed her recent illness and symptoms.  She has some residual cough but does not seem to be ill.  Cough syrup as below as needed.  Continue to hydrate.  I completed her short-term disability form regarding her recent absence regarding illnesses with her son and herself for COVID  [name] was seen today for needs form completed.  Diagnoses and all orders for this visit:  History of COVID-19  Encounter for completion of form with patient  Recent URI  Other orders -     promethazine-dextromethorphan (PROMETHAZINE-DM) 6.25-15 MG/5ML syrup; Take 5 mLs by mouth 4 (four) times daily as needed for cough.  F/u prn

## 2021-12-17 LAB — COLOGUARD: COLOGUARD: NEGATIVE

## 2021-12-27 ENCOUNTER — Other Ambulatory Visit: Payer: Self-pay | Admitting: Medical

## 2021-12-27 DIAGNOSIS — Z1231 Encounter for screening mammogram for malignant neoplasm of breast: Secondary | ICD-10-CM

## 2022-01-12 ENCOUNTER — Ambulatory Visit (INDEPENDENT_AMBULATORY_CARE_PROVIDER_SITE_OTHER): Payer: Managed Care, Other (non HMO) | Admitting: Medical

## 2022-01-12 ENCOUNTER — Other Ambulatory Visit: Payer: Self-pay

## 2022-01-12 VITALS — BP 120/84 | HR 98 | Temp 98.0°F | Wt 219.8 lb

## 2022-01-12 DIAGNOSIS — M79672 Pain in left foot: Secondary | ICD-10-CM

## 2022-01-12 DIAGNOSIS — M25561 Pain in right knee: Secondary | ICD-10-CM

## 2022-01-12 DIAGNOSIS — M79671 Pain in right foot: Secondary | ICD-10-CM | POA: Diagnosis not present

## 2022-01-12 DIAGNOSIS — M722 Plantar fascial fibromatosis: Secondary | ICD-10-CM | POA: Diagnosis not present

## 2022-01-12 DIAGNOSIS — M545 Low back pain, unspecified: Secondary | ICD-10-CM

## 2022-01-12 DIAGNOSIS — G8929 Other chronic pain: Secondary | ICD-10-CM

## 2022-01-12 MED ORDER — GABAPENTIN 100 MG PO CAPS: 100.0000 mg | ORAL_CAPSULE | Freq: Every day | ORAL | 0 refills | Status: DC

## 2022-01-12 NOTE — Progress Notes (Signed)
Subjective:  Dawn Goodwin is a 46 y.o. female who presents for Chief Complaint  Patient presents with   lower back and knee pain    Lower back and knee pain that radiates to leg.      Here for low back pain and right knee pain.   Also having feet pains in bottom of feet even hurting when getting up out of the bed.  Right foot worse than left.  Back pain and right knee pain x months.    Foot pain for months . Saw foot doctor in fall 2022 . Was given brace to wear.  Wears brace at home.     Has pain that radiates down right leg.  Can't jump rope or bend over like she used to do since pain has been there.   No numbness, tingling or weakness in legs though.    No fever, no numbness in upper thighs or genital.    Low back pain is worse from mid to right sided.     No bowel c/o, no abdominal pain, no incontinence, no urinary issues.   Using some ibuprofen which was helping but her orthodontist asked her not to use NSAIDs as it was interfering with her braces.  Saw podiatrist, Dr. Lindley [facility] foot [facility].    No other aggravating or relieving factors.    No other c/o.  Past Medical History:  Diagnosis Date   Acne    Back pain 2002   went through PT    GERD (gastroesophageal reflux disease)    intermittent   Headache    History of UTI    once prior as of 03/2015   Knee pain 2007   right   Pregnancy induced hypertension    Seizure disorder ([place])    from infancy til age 2yo, on medication during that time, etiology unclear?   Wears glasses    No current outpatient medications on file prior to visit.   No current facility-administered medications on file prior to visit.    The following portions of the patient's history were reviewed and updated as appropriate: allergies, current medications, past family history, past medical history, past social history, past surgical history and problem list.  ROS Otherwise as in subjective above    Objective: BP 120/84    Pulse 98     Temp 98 F (36.7 C)    Wt 219 lb 12.8 oz (99.7 kg)    BMI 34.95 kg/m   General appearance: alert, no distress, well developed, well nourished Abdomen: +bs, soft, non tender, non distended, no masses, no hepatomegaly, no splenomegaly Back: nontender to palpation, no swelling or deformity, but pain noted in lumbar mid to right lateral region, and flexion and extension ROM slightly reduced due to pain and some stiffness with flexion MSK: legs , hips, knees nontender, no swelling, no laxity.   No obvious foot tendnerss.  No pain with special tests of knee.   Pulses: 2+ radial pulses, 2+ pedal pulses, normal cap refill Ext: no edema Legs neurovascularly intact    Assessment: Encounter Diagnoses  Name Primary?   Chronic right-sided low back pain without sciatica Yes   Chronic pain of right knee    Foot pain, bilateral    Plantar fascia syndrome      Plan: We discussed her different pains.  She has a history of chronic back pain and chronic knee pain.  I reviewed her orthopedic notes and x-rays from 2020.  She had some joint space  narrowing of the knee and arthritic changes of knee and back at that time.  Exam with some back decreased range of motion and pain today but otherwise exam relatively unremarkable today  Begin trial gabapentin since she cannot take NSAID per orthopedics and Tylenol is not helping.  Advise she use her plantar fascia splint that was given by podiatry.  Can use bucket of ice water for foot pain 20 minutes at a time as needed.  Can also use cold therapy for knee 20 minutes on 20 minutes off.  Use relative rest and stretching.  Referral back to orthopedics for further evaluation and management  Donta was seen today for lower back and knee pain.  Diagnoses and all orders for this visit:  Chronic right-sided low back pain without sciatica -     AMB referral to orthopedics  Chronic pain of right knee -     AMB referral to orthopedics  Foot pain,  bilateral -     AMB referral to orthopedics  Plantar fascia syndrome -     AMB referral to orthopedics  Other orders -     gabapentin (NEURONTIN) 100 MG capsule; Take 1 capsule (100 mg total) by mouth at bedtime. Can increase to twice daily after 2 weeks    Follow up: pending ortho

## 2022-01-17 ENCOUNTER — Ambulatory Visit: Payer: Managed Care, Other (non HMO)

## 2022-01-19 ENCOUNTER — Ambulatory Visit: Payer: Managed Care, Other (non HMO) | Admitting: Physician Assistant

## 2022-01-26 ENCOUNTER — Telehealth: Payer: Self-pay | Admitting: Physician Assistant

## 2022-01-26 ENCOUNTER — Encounter: Payer: Self-pay | Admitting: Physician Assistant

## 2022-01-26 ENCOUNTER — Ambulatory Visit (INDEPENDENT_AMBULATORY_CARE_PROVIDER_SITE_OTHER): Payer: Managed Care, Other (non HMO)

## 2022-01-26 ENCOUNTER — Ambulatory Visit (INDEPENDENT_AMBULATORY_CARE_PROVIDER_SITE_OTHER): Payer: Managed Care, Other (non HMO) | Admitting: Physician Assistant

## 2022-01-26 ENCOUNTER — Other Ambulatory Visit: Payer: Self-pay

## 2022-01-26 DIAGNOSIS — M1711 Unilateral primary osteoarthritis, right knee: Secondary | ICD-10-CM

## 2022-01-26 DIAGNOSIS — M545 Low back pain, unspecified: Secondary | ICD-10-CM

## 2022-01-26 DIAGNOSIS — G8929 Other chronic pain: Secondary | ICD-10-CM

## 2022-01-26 NOTE — Progress Notes (Signed)
Office Visit Note   Patient: Dawn Goodwin           Date of Birth: 09-Dec-1976           MRN: [id number] Visit Date: 01/26/2022              Requested by: Carlena Hurl, PA-C [address] [place],  [place] [address] PCP: Carlena Hurl, PA-C   Assessment & Plan: Visit Diagnoses:  1. Chronic bilateral low back pain without sciatica   2. Primary osteoarthritis of right knee     Plan:  Given patient's continued low back pain despite stretching exercises and back exercises, NSAIDs and time recommend MRI of the low back due to the fact that her pain has remained severe and does awaken her at night.  MRIs to rule out HNP as a source of her low back pain.  We will see her back 3 to 4 days after the MRI to go over results discuss further treatment.  Regards to the right knee discussed with her quad strengthening exercises and knee friendly exercises.  We will see how she does with the cortisone injection which was given today.  At next visit we will evaluate her bilateral foot pain.  Questions were encouraged and answered at length.  Follow-Up Instructions: Return After MRI.   Orders:  Orders Placed This Encounter  Procedures   XR Lumbar Spine 2-3 Views   XR Knee 1-2 Views Right   No orders of the defined types were placed in this encounter.     Procedures: No procedures performed   Clinical Data: No additional findings.   Subjective: Chief Complaint  Patient presents with   Right Knee - Pain, Follow-up   Lower Back - Pain, Follow-up    HPI Dawn Goodwin is a 46 year old female who was last seen in 2020.  She comes in today due to continued low back pain and right knee pain.  She states that she did not get physical therapy but has continued to work with a Physiological scientist until until her back pain and knee pain became such that she could not.  However she has continued to do exercises at home which have included Peloton bike.  She states that her back and knee  pain are both getting worse.  She has had no new injury.  She denies any radicular symptoms down either leg.  She is having low back pain that awakens her.  Ranks her back pain to be 8 out of 10 pain.  She has had no bowel or bladder dysfunction no saddle anesthesia like symptoms.  She states coughing makes her back pain worse.  Again she does report a back injury 20 years ago with no residual effects. Right knee pain is became worse particularly since January toenail.  She describes no catching locking or painful popping.  She does note that knee does give way.  States knee pain is worse with squats.  Pain is mostly medial aspect of the knee.  She is also describing some swelling in both lower legs and that her socks leave indentions on her legs.  She is taking ibuprofen she also tried diclofenac and is having no relief with these.  Recently had to stop ibuprofen due to having braces.  She is taking Tylenol which is not helping.  Review of Systems  Constitutional:  Negative for chills and fever.  Musculoskeletal:  Positive for back pain.    Objective: Vital Signs: There were no vitals  taken for this visit.  Physical Exam Constitutional:      Appearance: She is not ill-appearing or diaphoretic.  Pulmonary:     Effort: Pulmonary effort is normal.  Neurological:     Mental Status: She is alert.  Psychiatric:        Mood and Affect: Mood normal.    Ortho Exam Bilateral knees full range of motion both knees.  Patellofemoral crepitus right greater than left.  No instability valgus varus stressing of either knee.  No abnormal warmth erythema or effusion of either knee.  Tenderness right knee medial joint line.  McMurray's is negative.  Calves are supple nontender.  Nonpitting edema bilateral lower extremities. Lumbar spine: 5 5 strength throughout the lower extremities positive straight leg raise bilaterally.  She has full extension and flexion of the lumbar spine and extension lumbar spine causes  pain in the low back.  Specialty Comments:  No specialty comments available.  Imaging: XR Knee 1-2 Views Right  Result Date: 01/26/2022 Right knee 2 views: Moderate narrowing medial joint line.  Mild patellofemoral changes.  Lateral compartment well-preserved.  Knee is well located.  No acute fractures or bony abnormalities otherwise.  XR Lumbar Spine 2-3 Views  Result Date: 01/26/2022 Lumbar spine 2 views: Disc base overall well-maintained.  No acute fractures.  Endplate spurring at multiple levels.  No spondylolisthesis.    PMFS History: Patient Active Problem List   Diagnosis Date Noted   Plantar fascia syndrome 01/12/2022   Foot pain, bilateral 01/12/2022   Chronic pain of right knee 01/12/2022   Chronic right-sided low back pain without sciatica 01/12/2022   BMI 34.0-34.9,adult 11/23/2021   Screen for colon cancer 11/23/2021   Encounter for screening mammogram for malignant neoplasm of breast 11/23/2021   Menorrhagia with regular cycle 11/23/2021   Missed period 11/23/2021   Screening for diabetes mellitus 11/23/2021   Chronic bilateral low back pain without sciatica 03/22/2021   Encounter for health maintenance examination in adult 11/05/2020   Screening for deficiency anemia 11/05/2020   Encounter for contraceptive management 11/05/2020   Encounter for vitamin deficiency screening 11/05/2020   Acne vulgaris 06/26/2020   Screening for cervical cancer 10/03/2017   Screen for STD (sexually transmitted disease) 08/25/2016   Need for influenza vaccination 08/25/2016   Past Medical History:  Diagnosis Date   Acne    Back pain 2002   went through PT    GERD (gastroesophageal reflux disease)    intermittent   Headache    History of UTI    once prior as of 03/2015   Knee pain 2007   right   Pregnancy induced hypertension    Seizure disorder ([place])    from infancy til age 77yo, on medication during that time, etiology unclear?   Wears glasses     Family History   Problem Relation Age of Onset   Hypertension Mother    Other Mother        total knee replacement   Arthritis Mother        knee replacement   Diabetes Father    Eczema Sister    Hypertension Maternal Grandmother    Diabetes Maternal Grandmother    Heart disease Maternal Grandmother        CHF   Alzheimer's disease Maternal Grandmother    Cancer Maternal Uncle        lung   Arthritis Cousin        knee replacement   Autism Son    Stroke  Neg Hx     Past Surgical History:  Procedure Laterality Date   CESAREAN SECTION  2006   DILATION AND CURETTAGE OF UTERUS  2013   DILATION AND EVACUATION  12/26/2011   Procedure: DILATATION AND EVACUATION;  Surgeon: Paulo Fruit;  Location: [facility];  Service: Gynecology;  Laterality: N/A;   Social History   Occupational History   Not on file  Tobacco Use   Smoking status: Never   Smokeless tobacco: Never  Vaping Use   Vaping Use: Never used  Substance and Sexual Activity   Alcohol use: No   Drug use: No   Sexual activity: Not Currently

## 2022-01-26 NOTE — Telephone Encounter (Signed)
Pt had question about follow up. Pt knows to follow up after mri for her back but wanted to know if she needed to make another appt before that one to have her knee rechecked prior to that appt or will the next appt be for both her back and knee. The best call back number is [phone]

## 2022-01-27 NOTE — Telephone Encounter (Signed)
Please advise 

## 2022-01-27 NOTE — Addendum Note (Signed)
Addended by: Robyne Peers on: 01/27/2022 08:25 AM   Modules accepted: Orders

## 2022-01-28 NOTE — Telephone Encounter (Signed)
LMOM for the patient of the below message from [place]

## 2022-02-04 IMAGING — MG DIGITAL SCREENING BILAT W/ TOMO W/ CAD
8 series · 8 of 24 positions shown · non-contrast
Comparison: Previous exam(s).

CLINICAL DATA: Screening.

EXAM:
DIGITAL SCREENING BILATERAL MAMMOGRAM WITH TOMO AND CAD

[L CC synth-2D]
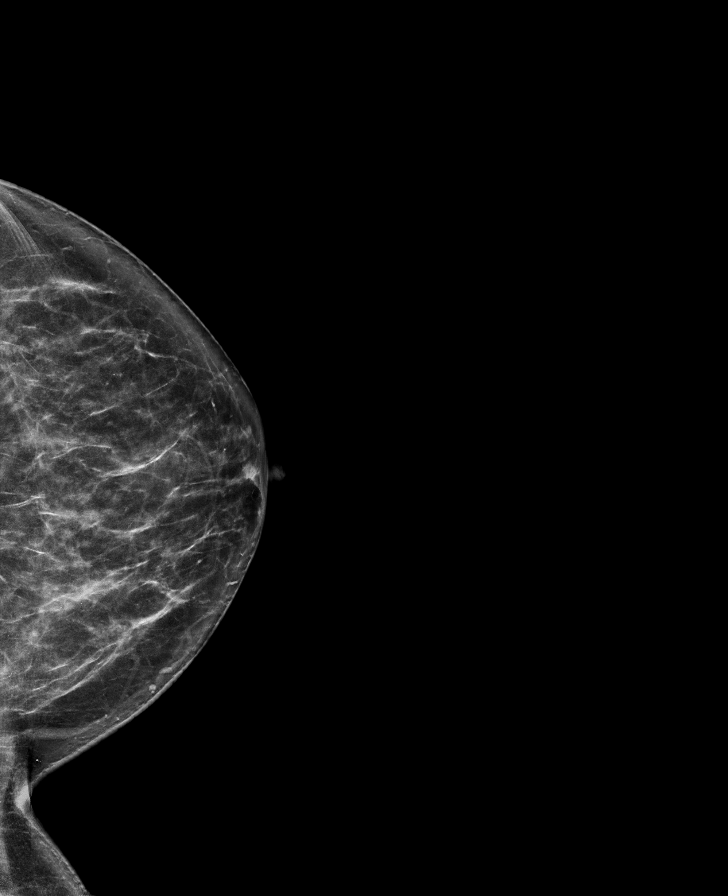

[R MLO synth-2D]
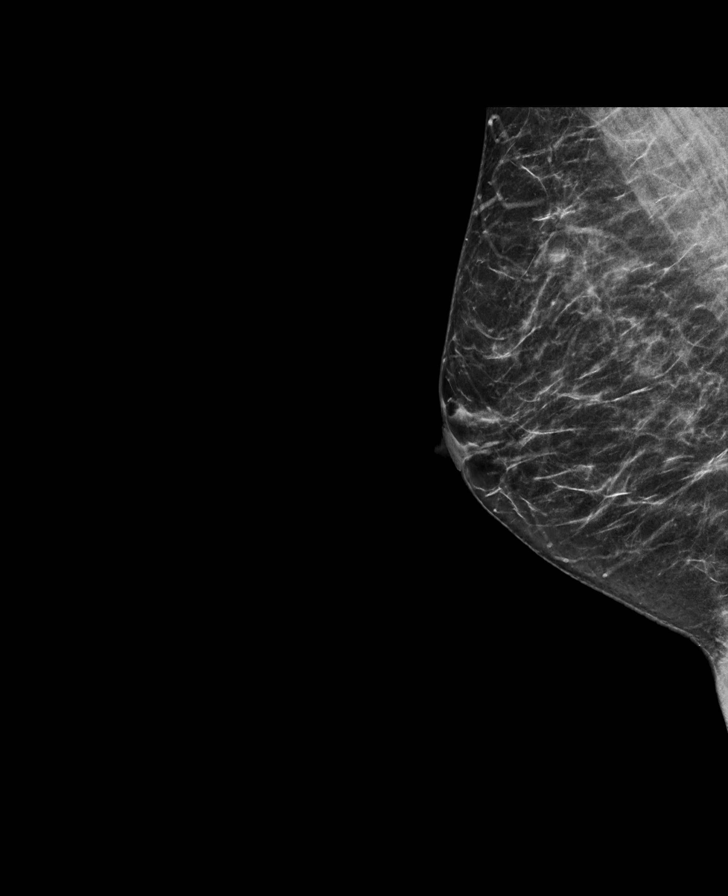

[R CC synth-2D]
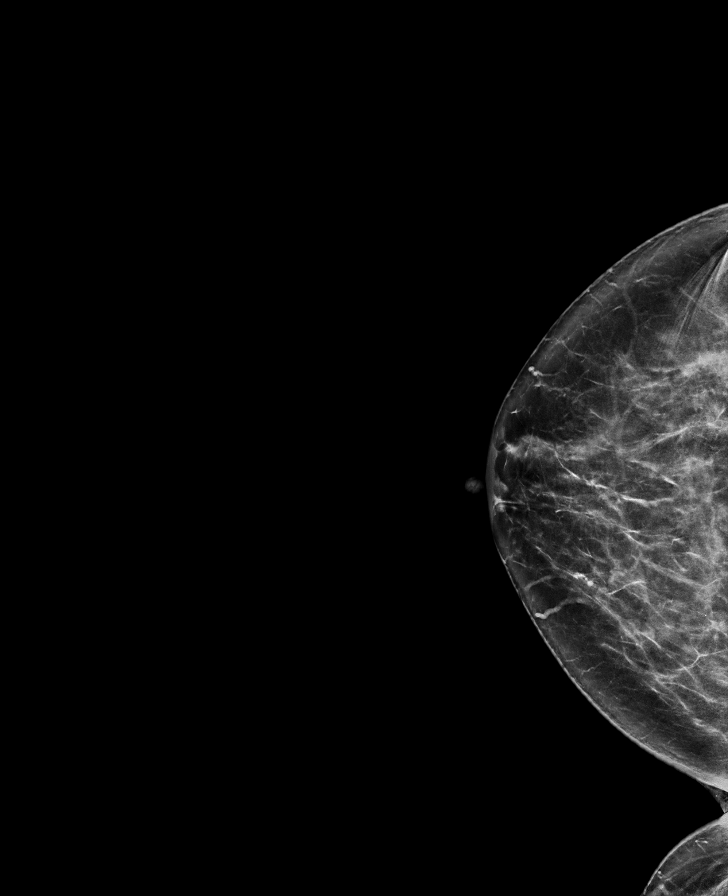

[L MLO synth-2D]
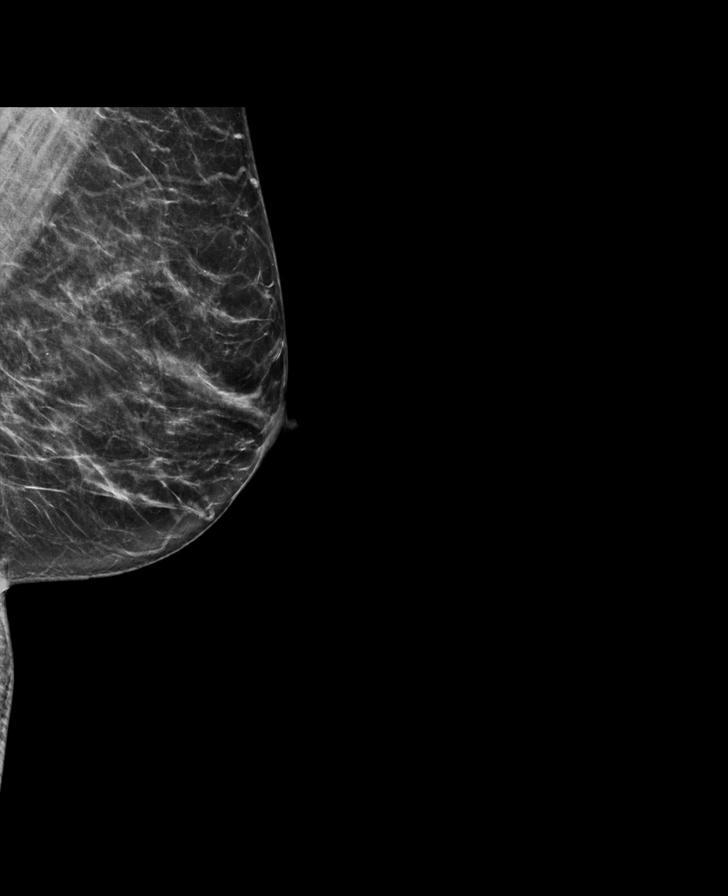

[L CC tomo · tomo slice 34/67.0]
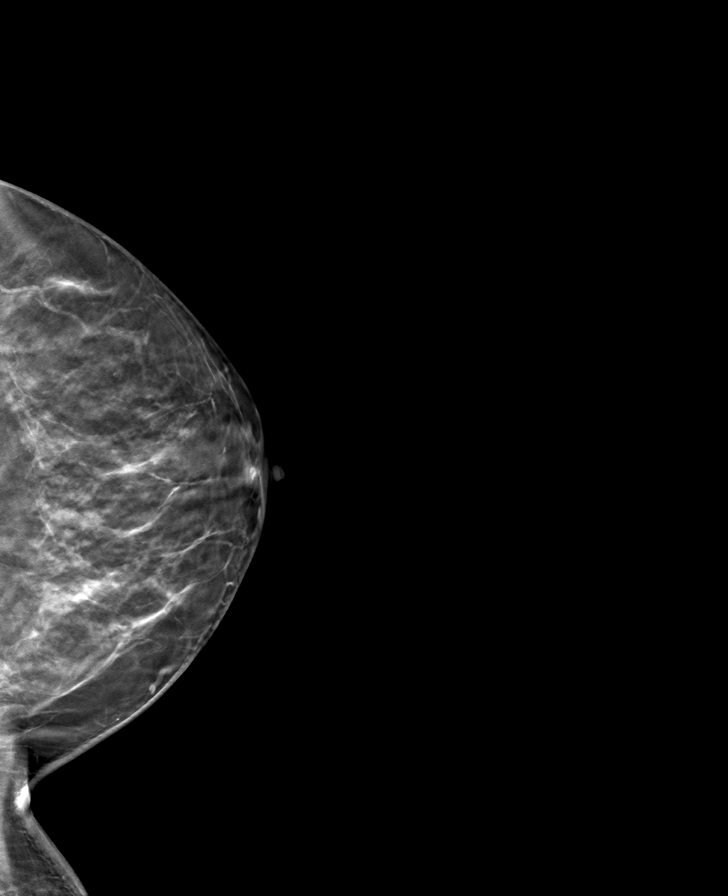

[R MLO tomo · tomo slice 33/65.0]
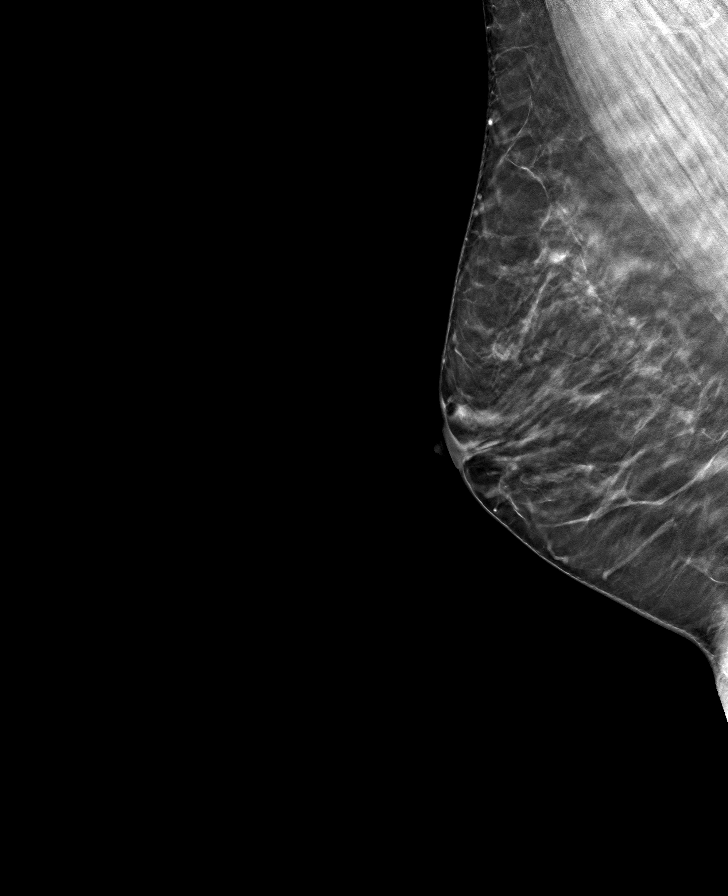

[R CC tomo · tomo slice 37/73.0]
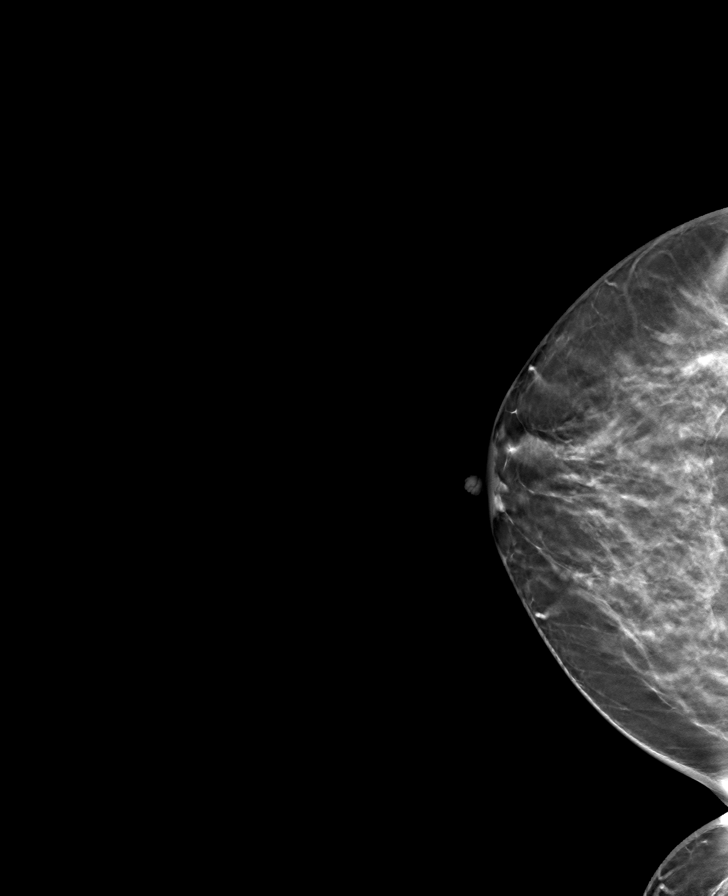

[L MLO tomo · tomo slice 35/69.0]
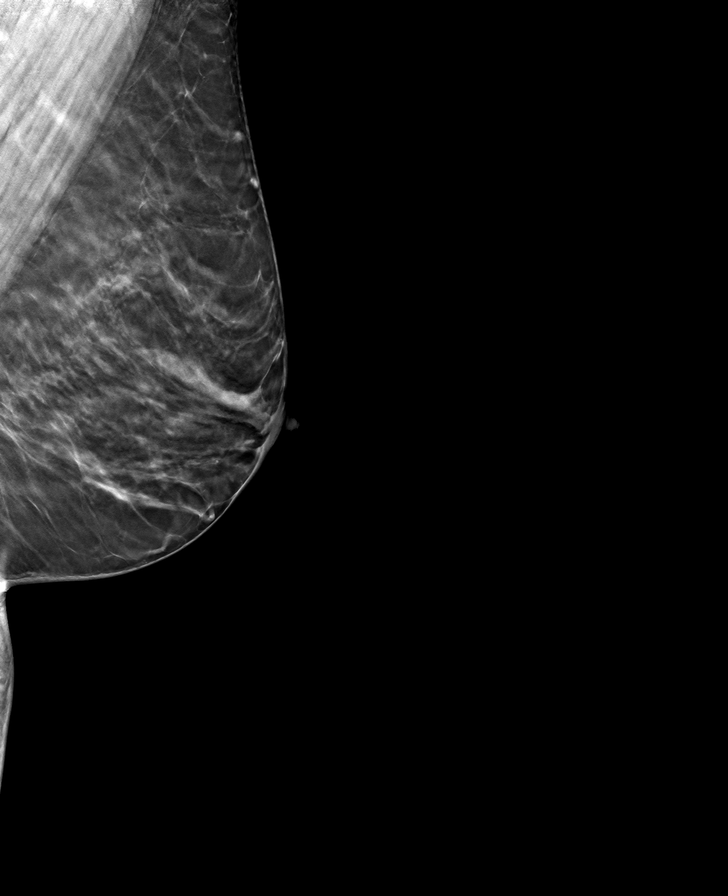

[8 of 24 positions shown; findings below may reference images not displayed]

ACR Breast Density Category b: There are scattered areas of
fibroglandular density.
FINDINGS: There are no findings suspicious for malignancy. Images were
processed with CAD.
IMPRESSION: No mammographic evidence of malignancy. A result letter of this
screening mammogram will be mailed directly to the patient.

RECOMMENDATION:
Screening mammogram in one year. (Code:[id number])

BI-RADS CATEGORY  1: Negative.

## 2022-02-07 ENCOUNTER — Telehealth: Payer: Self-pay

## 2022-02-07 NOTE — Telephone Encounter (Signed)
Received message stating pt's 2nd insurance denied MRI due to lack on 6 weeks of Physical therapy. LVM for pt to cb to advise of this.

## 2022-02-08 ENCOUNTER — Ambulatory Visit
Admission: RE | Admit: 2022-02-08 | Discharge: 2022-02-08 | Disposition: A | Discharge status: Home / Self Care | Payer: Managed Care, Other (non HMO) | Source: Ambulatory Visit | Attending: Medical | Admitting: Medical

## 2022-02-08 ENCOUNTER — Other Ambulatory Visit: Payer: Self-pay

## 2022-02-08 DIAGNOSIS — Z1231 Encounter for screening mammogram for malignant neoplasm of breast: Secondary | ICD-10-CM

## 2022-02-12 ENCOUNTER — Other Ambulatory Visit: Payer: Managed Care, Other (non HMO)

## 2022-02-14 ENCOUNTER — Ambulatory Visit: Payer: Managed Care, Other (non HMO) | Admitting: Orthopaedic Surgery

## 2022-02-25 ENCOUNTER — Ambulatory Visit
Admission: RE | Admit: 2022-02-25 | Discharge: 2022-02-25 | Disposition: A | Discharge status: Home / Self Care | Payer: Managed Care, Other (non HMO) | Source: Ambulatory Visit | Attending: Physician Assistant | Admitting: Physician Assistant

## 2022-02-25 ENCOUNTER — Other Ambulatory Visit: Payer: Self-pay

## 2022-02-25 DIAGNOSIS — G8929 Other chronic pain: Secondary | ICD-10-CM

## 2022-03-01 ENCOUNTER — Ambulatory Visit (INDEPENDENT_AMBULATORY_CARE_PROVIDER_SITE_OTHER): Payer: Managed Care, Other (non HMO) | Admitting: Physician Assistant

## 2022-03-01 ENCOUNTER — Encounter: Payer: Self-pay | Admitting: Physician Assistant

## 2022-03-01 DIAGNOSIS — G8929 Other chronic pain: Secondary | ICD-10-CM | POA: Diagnosis not present

## 2022-03-01 DIAGNOSIS — M1711 Unilateral primary osteoarthritis, right knee: Secondary | ICD-10-CM

## 2022-03-01 DIAGNOSIS — M545 Low back pain, unspecified: Secondary | ICD-10-CM | POA: Diagnosis not present

## 2022-03-01 NOTE — Addendum Note (Signed)
Addended by: Robyne Peers on: 03/01/2022 01:51 PM ? ? Modules accepted: Orders ? ?

## 2022-03-01 NOTE — Progress Notes (Signed)
HPI: Dawn Goodwin returns today for follow-up of her right knee pain and also her low back pain.  She underwent an MRI of her lumbar spine.  She states she continues to have low back pain that she rates to be 8 out of 10 pain at worst.  It awakens her at night.  She states she has severe pain whenever she wakes up in the mornings.  She notes that her knee pain got much better for a week and a half after the cortisone injection.  Now she feels like her knee pain is worse.  Again radiographs did show moderate narrowing medial joint line mild patellofemoral arthritic changes.  She is having no catching locking or giving way of the right knee.  Pain in the right leg radiates down the anterior aspect of the lower leg to the ankle.  She states that bending forward or extension of the back causes pain in her lower legs.  She feels she is losing mobility in her right leg and the leg feels heavy.  She is currently taking no medications for the back or the knee pain. ?MRI MRI is reviewed with the patient.  MRI of the lumbar spine showed disc bulge at L4-5 with shallow paracentral disc protrusion at L4-5.  Mild to moderate right subarticular stenosis without canal stenosis.  No foraminal stenosis.  L4-5 S1 mild bilateral facet arthropathy.  No foraminal canal stenosis. ? ?Review of systems see HPI otherwise negative ? ?Physical exam: Bilateral hips excellent range of motion without pain.  Right knee full extension full flexion.  Tenderness along medial joint line.  No significant crepitus with passive range of motion of the right knee.  No abnormal warmth erythema or effusion right knee.  Calf supple nontender right leg. ?Lumbar spine: 5 out of 5 strength throughout the lower extremities against resistance negative straight leg raise bilaterally.  She has full flexion full extension lumbar spine.  Extension lumbar spine causes low back pain and causes pain in the anterior aspect of her right leg down to the  ankle. ? ?Impression: Right knee osteoarthritis ?Lumbar radiculopathy ? ?Plan: Recommend lumbar epidural steroid injection L4-5 with Dr. Ernestina Patches.  We will see her back 4 weeks after this to see how much pain relief she got.  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  She will apply Voltaren gel over the medial aspect of the knee 4 g up to 4 times daily.  Questions were encouraged and answered at length today. ?

## 2022-03-04 ENCOUNTER — Telehealth: Payer: Self-pay | Admitting: Physical Medicine and Rehabilitation

## 2022-03-04 ENCOUNTER — Telehealth: Payer: Self-pay

## 2022-03-04 NOTE — Telephone Encounter (Signed)
Please call pt back regarding schedule  ?

## 2022-03-04 NOTE — Telephone Encounter (Signed)
Patient left VM on triage line. States she is returning someone's call. Look like Shena left her VM ? ? ?[phone] ?

## 2022-03-30 ENCOUNTER — Encounter (INDEPENDENT_AMBULATORY_CARE_PROVIDER_SITE_OTHER): Payer: Self-pay | Admitting: Bariatrics

## 2022-03-30 ENCOUNTER — Ambulatory Visit (INDEPENDENT_AMBULATORY_CARE_PROVIDER_SITE_OTHER): Payer: Managed Care, Other (non HMO) | Admitting: Bariatrics

## 2022-03-30 VITALS — BP 132/87 | HR 86 | Temp 98.3°F | Ht 67.0 in | Wt 208.0 lb

## 2022-03-30 DIAGNOSIS — Z0000 Encounter for general adult medical examination without abnormal findings: Secondary | ICD-10-CM

## 2022-03-30 DIAGNOSIS — R5383 Other fatigue: Secondary | ICD-10-CM

## 2022-03-30 DIAGNOSIS — M545 Low back pain, unspecified: Secondary | ICD-10-CM | POA: Diagnosis not present

## 2022-03-30 DIAGNOSIS — Z6832 Body mass index (BMI) 32.0-32.9, adult: Secondary | ICD-10-CM

## 2022-03-30 DIAGNOSIS — Z1331 Encounter for screening for depression: Secondary | ICD-10-CM

## 2022-03-30 DIAGNOSIS — E559 Vitamin D deficiency, unspecified: Secondary | ICD-10-CM

## 2022-03-30 DIAGNOSIS — E668 Other obesity: Secondary | ICD-10-CM

## 2022-03-30 DIAGNOSIS — E669 Obesity, unspecified: Secondary | ICD-10-CM

## 2022-03-30 DIAGNOSIS — M25561 Pain in right knee: Secondary | ICD-10-CM

## 2022-03-30 DIAGNOSIS — R7309 Other abnormal glucose: Secondary | ICD-10-CM

## 2022-03-30 DIAGNOSIS — R0602 Shortness of breath: Secondary | ICD-10-CM | POA: Diagnosis not present

## 2022-03-30 DIAGNOSIS — G8929 Other chronic pain: Secondary | ICD-10-CM

## 2022-03-31 ENCOUNTER — Encounter (INDEPENDENT_AMBULATORY_CARE_PROVIDER_SITE_OTHER): Payer: Self-pay | Admitting: Bariatrics

## 2022-03-31 DIAGNOSIS — E559 Vitamin D deficiency, unspecified: Secondary | ICD-10-CM | POA: Insufficient documentation

## 2022-03-31 LAB — COMPREHENSIVE METABOLIC PANEL
ALT: 14 IU/L (ref 0–32)
AST: 17 IU/L (ref 0–40)
Albumin/Globulin Ratio: 1.3 (ref 1.2–2.2)
Albumin: 4 g/dL (ref 3.8–4.8)
Alkaline Phosphatase: 104 IU/L (ref 44–121)
BUN/Creatinine Ratio: 18 (ref 9–23)
BUN: 14 mg/dL (ref 6–24)
Bilirubin Total: 0.4 mg/dL (ref 0.0–1.2)
CO2: 22 mmol/L (ref 20–29)
Calcium: 9.3 mg/dL (ref 8.7–10.2)
Chloride: 106 mmol/L (ref 96–106)
Creatinine, Ser: 0.79 mg/dL (ref 0.57–1.00)
Globulin, Total: 3.1 g/dL (ref 1.5–4.5)
Glucose: 75 mg/dL (ref 70–99)
Potassium: 4.6 mmol/L (ref 3.5–5.2)
Sodium: 143 mmol/L (ref 134–144)
Total Protein: 7.1 g/dL (ref 6.0–8.5)
eGFR: 93 mL/min/{1.73_m2} (ref >59)

## 2022-03-31 LAB — TSH+T4F+T3FREE
Free T4: 1.03 ng/dL (ref 0.82–1.77)
T3, Free: 2.7 pg/mL (ref 2.0–4.4)
TSH: 0.695 u[IU]/mL (ref 0.450–4.500)

## 2022-03-31 LAB — LIPID PANEL WITH LDL/HDL RATIO
Cholesterol, Total: 159 mg/dL (ref 100–199)
HDL: 49 mg/dL (ref >39)
LDL Chol Calc (NIH): 101 mg/dL — ABNORMAL HIGH (ref 0–99)
LDL/HDL Ratio: 2.1 ratio (ref 0.0–3.2)
Triglycerides: 43 mg/dL (ref 0–149)
VLDL Cholesterol Cal: 9 mg/dL (ref 5–40)

## 2022-03-31 LAB — HEMOGLOBIN A1C
Est. average glucose Bld gHb Est-mCnc: 103 mg/dL
Hgb A1c MFr Bld: 5.2 % (ref 4.8–5.6)

## 2022-03-31 LAB — VITAMIN D 25 HYDROXY (VIT D DEFICIENCY, FRACTURES): Vit D, 25-Hydroxy: 27.3 ng/mL — ABNORMAL LOW (ref 30.0–100.0)

## 2022-03-31 LAB — INSULIN, RANDOM: INSULIN: 4.3 u[IU]/mL (ref 2.6–24.9)

## 2022-04-05 ENCOUNTER — Telehealth: Payer: Self-pay | Admitting: Medical

## 2022-04-05 NOTE — Progress Notes (Signed)
? ? ?Chief Complaint:  ? ?OBESITY ?Dawn Goodwin (MR# [id number]) is a 46 y.o. female who presents for evaluation and treatment of obesity and related comorbidities. Current BMI is Body mass index is 32.58 kg/m?Marland Kitchen Dawn Goodwin has been struggling with her weight for many years and has been unsuccessful in either losing weight, maintaining weight loss, or reaching her healthy weight goal. ? ?Cache does like to cook but time is the limiting factor. She is good with getting in her protein.  ? ?Dawn Goodwin is currently in the action stage of change and ready to dedicate time achieving and maintaining a healthier weight. Dawn Goodwin is interested in becoming our patient and working on intensive lifestyle modifications including (but not limited to) diet and exercise for weight loss. ? ?Dawn Goodwin's habits were reviewed today and are as follows: Her family eats meals together, she thinks her family will eat healthier with her, her desired weight loss is 43 pounds, she started gaining weight when I got pregnant in 2005, her heaviest weight ever was 260 pounds, she has significant food cravings issues, she snacks frequently in the evenings, she skips meals frequently, she is frequently drinking liquids with calories, she frequently makes poor food choices, she has problems with excessive hunger, she frequently eats larger portions than normal, and she struggles with emotional eating. ? ?Depression Screen ?Dawn Goodwin's Food and Mood (modified PHQ-9) score was 10. ? ? ?  03/31/2022  ?  6:44 AM  ?Depression screen PHQ 2/9  ?Decreased Interest 3  ?Down, Depressed, Hopeless 1  ?PHQ - 2 Score 4  ?Altered sleeping 0  ?Tired, decreased energy 3  ?Change in appetite 1  ?Feeling bad or failure about yourself  1  ?Trouble concentrating 1  ?Moving slowly or fidgety/restless 0  ?Suicidal thoughts 0  ?PHQ-9 Score 10  ?Difficult doing work/chores Not difficult at all  ? ?Subjective:  ? ?1. Other fatigue ?Dawn Goodwin will continue activities. Dawn Goodwin admits  to daytime somnolence and admits to waking up still tired. Patient has a history of symptoms of daytime fatigue and morning fatigue. Dawn Goodwin generally gets 4 hours of sleep per night, and states that she has difficulty falling asleep. Snoring are not present. Apneic episodes are not present. Epworth Sleepiness Score is 8.   ? ?2. SOB (shortness of breath) on exertion ?Dawn Goodwin notes increasing shortness of breath with exercising and seems to be worsening over time with weight gain. She notes getting out of breath sooner with activity than she used to. This has not gotten worse recently. Dawn Goodwin denies shortness of breath at rest or orthopnea.  ? ?3. Chronic right-sided low back pain without sciatica ?Dawn Goodwin notes chronic right side low back pain without sciatica.  ? ?4. Chronic pain of right knee ?Dawn Goodwin notes chronic right knee pain.  ? ?5. Vitamin D deficiency ?Dawn Goodwin is not on Vitamin D currently. ? ?6. Elevated glucose ?We discussed elevated glucose today.  ? ?7. Healthcare maintenance ?We discussed healthcare maintenance today.  ? ?Assessment/Plan:  ? ?1. Other fatigue ?We will review EKG and we will discuss Thyroid panel today. Dawn Goodwin does feel that her weight is causing her energy to be lower than it should be. Fatigue may be related to obesity, depression or many other causes. Labs will be ordered, and in the meanwhile, Dawn Goodwin will focus on self care including making healthy food choices, increasing physical activity and focusing on stress reduction.  ?- EKG 12-Lead ?- TSH+T4F+T3Free ? ?2. SOB (shortness of breath) on exertion ?Dawn Goodwin does feel that she  gets out of breath more easily that she used to when she exercises. Dawn Goodwin's shortness of breath appears to be obesity related and exercise induced. She has agreed to work on weight loss and gradually increase exercise to treat her exercise induced shortness of breath. Will continue to monitor closely.  ? ?3. Chronic right-sided low back pain without  sciatica ?Dawn Goodwin will follow up with her primary care provider and orthopedics.  ? ?4. Chronic pain of right knee ?Dawn Goodwin will follow up with orthopedics.  ? ?5. Vitamin D deficiency ?Low Vitamin D level contributes to fatigue and are associated with obesity, breast, and colon cancer. We will check Vitamin D today. Dawn Goodwin will follow-up for routine testing of Vitamin D, at least 2-3 times per year to avoid over-replacement. ? ?- VITAMIN D 25 Hydroxy (Vit-D Deficiency, Fractures) ? ?6. Elevated glucose ?We will check insulin, A1C, CMP and Lipids today.  ? ?- Insulin, random ?- Hemoglobin A1c ?- Comprehensive metabolic panel ?- Lipid Panel With LDL/HDL Ratio ? ?7. Healthcare maintenance ?We will check CMP, Lipid, insulin, and A1C today.  ? ?- Insulin, random ?- Hemoglobin A1c ?- Lipid Panel With LDL/HDL Ratio ?- TSH+T4F+T3Free ?- VITAMIN D 25 Hydroxy (Vit-D Deficiency, Fractures) ?- Comprehensive metabolic panel ? ?8. Depression screen ?Dawn Goodwin had a positive depression screening. Depression is commonly associated with obesity and often results in emotional eating behaviors. We will monitor this closely and work on CBT to help improve the non-hunger eating patterns. Referral to Psychology may be required if no improvement is seen as she continues in our clinic.  ? ?9. Class 1 obesity with serious comorbidity and body mass index (BMI) of 32.0 to 32.9 in adult, unspecified obesity type ?Dawn Goodwin is currently in the action stage of change and her goal is to continue with weight loss efforts. I recommend Dawn Goodwin begin the structured treatment plan as follows: ? ?She has agreed to the Category 2 Plan. ? ?Dawn Goodwin will continue meal planning and she will continue intentional eating. She will increase her water intake (64 ounces). She will not meal skip.  ? ?Exercise goals: No exercise has been prescribed at this time.  ? ?Behavioral modification strategies: increasing lean protein intake, decreasing simple carbohydrates,  increasing vegetables, increasing water intake, decreasing eating out, no skipping meals, meal planning and cooking strategies, keeping healthy foods in the home, and planning for success. ? ?She was informed of the importance of frequent follow-up visits to maximize her success with intensive lifestyle modifications for her multiple health conditions. She was informed we would discuss her lab results at her next visit unless there is a critical issue that needs to be addressed sooner. Dawn Goodwin agreed to keep her next visit at the agreed upon time to discuss these results. ? ?Objective:  ? ?Blood pressure 132/87, pulse 86, temperature 98.3 ?F (36.8 ?C), height 5' 7"  (1.702 m), weight 208 lb (94.3 kg), SpO2 (!) 10 %. Body mass index is 32.58 kg/m?. ? ?EKG: Normal sinus rhythm, rate 83 bpm. ? ?Indirect Calorimeter completed today shows a VO2 of 211 and a REE of 1454.  Her calculated basal metabolic rate is 1610 thus her basal metabolic rate is worse than expected. ? ?General: Cooperative, alert, well developed, in no acute distress. ?HEENT: Conjunctivae and lids unremarkable. ?Cardiovascular: Regular rhythm.  ?Lungs: Normal work of breathing. ?Neurologic: No focal deficits.  ? ?Lab Results  ?Component Value Date  ? CREATININE 0.79 03/30/2022  ? BUN 14 03/30/2022  ? NA 143 03/30/2022  ? K 4.6  03/30/2022  ? CL 106 03/30/2022  ? CO2 22 03/30/2022  ? ?Lab Results  ?Component Value Date  ? ALT 14 03/30/2022  ? AST 17 03/30/2022  ? ALKPHOS 104 03/30/2022  ? BILITOT 0.4 03/30/2022  ? ?Lab Results  ?Component Value Date  ? HGBA1C 5.2 03/30/2022  ? HGBA1C 5.1 11/26/2021  ? HGBA1C 5.1 10/04/2018  ? HGBA1C 5.0 10/03/2017  ? HGBA1C 5.0 08/25/2016  ? ?Lab Results  ?Component Value Date  ? INSULIN 4.3 03/30/2022  ? ?Lab Results  ?Component Value Date  ? TSH 0.695 03/30/2022  ? ?Lab Results  ?Component Value Date  ? CHOL 159 03/30/2022  ? HDL 49 03/30/2022  ? LDLCALC 101 (H) 03/30/2022  ? TRIG 43 03/30/2022  ? CHOLHDL 2.9 11/26/2021   ? ?Lab Results  ?Component Value Date  ? WBC 9.3 11/26/2021  ? HGB 12.4 11/26/2021  ? HCT 38.4 11/26/2021  ? MCV 92 11/26/2021  ? PLT 266 11/26/2021  ? ?Lab Results  ?Component Value Date  ? IRON 151 11

## 2022-04-05 NOTE — Telephone Encounter (Signed)
Pt [facility], she noticed her vitamin D is low and wants to know is she needs to take vitamin D ?And if so what does she need to get ?

## 2022-04-06 ENCOUNTER — Other Ambulatory Visit: Payer: Self-pay | Admitting: Medical

## 2022-04-06 ENCOUNTER — Encounter (INDEPENDENT_AMBULATORY_CARE_PROVIDER_SITE_OTHER): Payer: Self-pay | Admitting: Bariatrics

## 2022-04-06 MED ORDER — VITAMIN D (ERGOCALCIFEROL) 1.25 MG (50000 UNIT) PO CAPS: 50000.0000 [IU] | ORAL_CAPSULE | ORAL | 0 refills | Status: DC

## 2022-04-06 NOTE — Telephone Encounter (Signed)
Pt was notified of results

## 2022-04-07 ENCOUNTER — Ambulatory Visit: Payer: Self-pay

## 2022-04-07 ENCOUNTER — Other Ambulatory Visit: Payer: Self-pay

## 2022-04-07 ENCOUNTER — Encounter (HOSPITAL_COMMUNITY): Payer: Self-pay

## 2022-04-07 ENCOUNTER — Encounter: Payer: Self-pay | Admitting: Physical Medicine and Rehabilitation

## 2022-04-07 ENCOUNTER — Emergency Department (HOSPITAL_COMMUNITY)
Admission: EM | Admit: 2022-04-07 | Discharge: 2022-04-08 | Disposition: A | Discharge status: Home / Self Care | Payer: Managed Care, Other (non HMO) | Attending: Emergency Medicine | Admitting: Emergency Medicine

## 2022-04-07 ENCOUNTER — Ambulatory Visit (INDEPENDENT_AMBULATORY_CARE_PROVIDER_SITE_OTHER): Payer: Managed Care, Other (non HMO) | Admitting: Physical Medicine and Rehabilitation

## 2022-04-07 DIAGNOSIS — R112 Nausea with vomiting, unspecified: Secondary | ICD-10-CM | POA: Diagnosis present

## 2022-04-07 DIAGNOSIS — D72829 Elevated white blood cell count, unspecified: Secondary | ICD-10-CM | POA: Diagnosis not present

## 2022-04-07 DIAGNOSIS — M5416 Radiculopathy, lumbar region: Secondary | ICD-10-CM

## 2022-04-07 LAB — CBC
HCT: 37.2 % (ref 36.0–46.0)
Hemoglobin: 12.3 g/dL (ref 12.0–15.0)
MCH: 30.1 pg (ref 26.0–34.0)
MCHC: 33.1 g/dL (ref 30.0–36.0)
MCV: 91 fL (ref 80.0–100.0)
Platelets: 251 10*3/uL (ref 150–400)
RBC: 4.09 MIL/uL (ref 3.87–5.11)
RDW: 12.7 % (ref 11.5–15.5)
WBC: 15.9 10*3/uL — ABNORMAL HIGH (ref 4.0–10.5)
nRBC: 0 % (ref 0.0–0.2)

## 2022-04-07 LAB — COMPREHENSIVE METABOLIC PANEL
ALT: 23 U/L (ref 0–44)
AST: 30 U/L (ref 15–41)
Albumin: 4.1 g/dL (ref 3.5–5.0)
Alkaline Phosphatase: 81 U/L (ref 38–126)
Anion gap: 7 (ref 5–15)
BUN: 12 mg/dL (ref 6–20)
CO2: 25 mmol/L (ref 22–32)
Calcium: 9.2 mg/dL (ref 8.9–10.3)
Chloride: 107 mmol/L (ref 98–111)
Creatinine, Ser: 0.89 mg/dL (ref 0.44–1.00)
GFR, Estimated: >60 mL/min (ref >60)
Glucose, Bld: 139 mg/dL — ABNORMAL HIGH (ref 70–99)
Potassium: 3.8 mmol/L (ref 3.5–5.1)
Sodium: 139 mmol/L (ref 135–145)
Total Bilirubin: 0.4 mg/dL (ref 0.3–1.2)
Total Protein: 8.5 g/dL — ABNORMAL HIGH (ref 6.5–8.1)

## 2022-04-07 MED ORDER — METHYLPREDNISOLONE ACETATE 80 MG/ML IJ SUSP
80.0000 mg | Freq: Once | INTRAMUSCULAR | Status: AC
  Administered 2022-04-07: 80 mg

## 2022-04-07 NOTE — Progress Notes (Signed)
Pt state lower back pain that travels down her right leg. Pt state walking, standing and getting out of bed makes the pain worse. Pt state she takes over the counter pain meds to help ease her pain. ? ?Numeric Pain Rating Scale and Functional Assessment ?Average Pain 8 ? ? ?In the last MONTH (on 0-10 scale) has pain interfered with the following? ? ?1. General activity like being  able to carry out your everyday physical activities such as walking, climbing stairs, carrying groceries, or moving a chair?  ?Rating(10) ? ? ?+Driver, -BT, -Dye Allergies. ? ?

## 2022-04-07 NOTE — Patient Instructions (Signed)

## 2022-04-07 NOTE — ED Triage Notes (Signed)
Per EMS- Patient received a Cortisone injection to her back at 1300. Once patient got home she has been experiencing N/v. EMS reports that the patient has been drinking red gatorade. ?

## 2022-04-08 DIAGNOSIS — R112 Nausea with vomiting, unspecified: Secondary | ICD-10-CM | POA: Diagnosis not present

## 2022-04-08 LAB — LIPASE, BLOOD: Lipase: 29 U/L (ref 11–51)

## 2022-04-08 MED ORDER — ONDANSETRON 4 MG PO TBDP: 4.0000 mg | ORAL_TABLET | Freq: Three times a day (TID) | ORAL | 0 refills | Status: DC | PRN

## 2022-04-08 MED ORDER — SODIUM CHLORIDE 0.9 % IV BOLUS
1000.0000 mL | Freq: Once | INTRAVENOUS | Status: AC
Start: 2022-04-08 — End: 2022-04-08
  Administered 2022-04-08: 1000 mL via INTRAVENOUS

## 2022-04-08 MED ORDER — DIPHENHYDRAMINE HCL 50 MG/ML IJ SOLN
12.5000 mg | Freq: Once | INTRAMUSCULAR | Status: AC
  Administered 2022-04-08: 12.5 mg via INTRAVENOUS
  Filled 2022-04-08: qty 1

## 2022-04-08 MED ORDER — METOCLOPRAMIDE HCL 5 MG/ML IJ SOLN
10.0000 mg | INTRAMUSCULAR | Status: AC
  Administered 2022-04-08: 10 mg via INTRAVENOUS
  Filled 2022-04-08: qty 2

## 2022-04-08 NOTE — Discharge Instructions (Signed)
Take the prescribed medication as directed.  Make sure to rest and hydrate well throughout the day. ?Follow-up with your primary care doctor. ?Return to the ED for new or worsening symptoms. ?

## 2022-04-08 NOTE — ED Provider Notes (Signed)
?[facility] DEPT ?Provider Note ? ? ?CSN: [id number] ?Arrival date & time: 04/07/22  1828 ? ?  ? ?History ? ?Chief Complaint  ?Patient presents with  ? Emesis  ? ? ?Dawn Goodwin is a 46 y.o. female. ? ?The history is provided by the patient and medical records.  ?Emesis ? ?46 year old female presenting to the ED with nausea and vomiting.  Patient states she had cortisone injection earlier today at the orthopedic office, this was without any complication.  States this evening she began having nausea and vomiting.  Denies any diarrhea, fever, or chills.  States she has had prior cortisone injections many years ago but never had issues like this associated with it.  Denies any significant pain in her back.  No numbness/weakness of the legs.  No bowel or bladder incontinence.  No meds taken PTA. ? ?Home Medications ?Prior to Admission medications   ?Medication Sig Start Date End Date Taking? Authorizing Provider  ?Vitamin D, Ergocalciferol, (DRISDOL) 1.25 MG (50000 UNIT) CAPS capsule Take 1 capsule (50,000 Units total) by mouth every 7 (seven) days. 04/06/22   Tysinger, Camelia Eng, PA-C  ?   ? ?Allergies    ?Cyclinex [tetracycline hcl]   ? ?Review of Systems   ?Review of Systems  ?Gastrointestinal:  Positive for vomiting.  ?All other systems reviewed and are negative. ? ?Physical Exam ?Updated Vital Signs ?BP (!) 105/55   Pulse 86   Temp 98.7 ?F (37.1 ?C) (Oral)   Resp 16   Ht 5' 7"  (1.702 m)   Wt 94.3 kg   LMP  (LMP Unknown)   SpO2 95%   BMI 32.58 kg/m?  ? ?Physical Exam ?Vitals and nursing note reviewed.  ?Constitutional:   ?   Appearance: She is well-developed.  ?HENT:  ?   Head: Normocephalic and atraumatic.  ?Eyes:  ?   Conjunctiva/sclera: Conjunctivae normal.  ?   Pupils: Pupils are equal, round, and reactive to light.  ?Cardiovascular:  ?   Rate and Rhythm: Normal rate and regular rhythm.  ?   Heart sounds: Normal heart sounds.  ?Pulmonary:  ?   Effort: Pulmonary effort is  normal. No respiratory distress.  ?   Breath sounds: Normal breath sounds. No rhonchi.  ?Abdominal:  ?   General: Bowel sounds are normal.  ?   Palpations: Abdomen is soft.  ?   Tenderness: There is no abdominal tenderness. There is no rebound.  ?   Comments: Soft, non-tender  ?Musculoskeletal:     ?   General: Normal range of motion.  ?   Cervical back: Normal range of motion.  ?Skin: ?   General: Skin is warm and dry.  ?Neurological:  ?   Mental Status: She is alert and oriented to person, place, and time.  ? ? ?ED Results / Procedures / Treatments   ?Labs ?(all labs ordered are listed, but only abnormal results are displayed) ?Labs Reviewed  ?CBC - Abnormal; Notable for the following components:  ?    Result Value  ? WBC 15.9 (*)   ? All other components within normal limits  ?COMPREHENSIVE METABOLIC PANEL - Abnormal; Notable for the following components:  ? Glucose, Bld 139 (*)   ? Total Protein 8.5 (*)   ? All other components within normal limits  ?LIPASE, BLOOD  ? ? ?EKG ?None ? ?Radiology ?XR C-ARM NO REPORT ? ?Result Date: 04/07/2022 ?Please see Notes tab for imaging impression.  ? ?Procedures ?Procedures  ? ? ?Medications  Ordered in ED ?Medications  ?sodium chloride 0.9 % bolus 1,000 mL (1,000 mLs Intravenous New Bag/Given 04/08/22 0034)  ?diphenhydrAMINE (BENADRYL) injection 12.5 mg (12.5 mg Intravenous Given 04/08/22 0034)  ?metoCLOPramide (REGLAN) injection 10 mg (10 mg Intravenous Given 04/08/22 0035)  ? ? ?ED Course/ Medical Decision Making/ A&P ?  ?                        ?Medical Decision Making ?Amount and/or Complexity of Data Reviewed ?External Data Reviewed: labs, radiology and notes. ?Labs: ordered. ?ECG/medicine tests: ordered and independent interpretation performed. ? ?Risk ?Prescription drug management. ? ? ?46 year old female presenting to the ED with nausea and vomiting after receiving cortisone injection at orthopedic office earlier today.  She has had these before but never had this type of  reaction.  She is afebrile, nontoxic in appearance here.  Her abdomen is soft and nontender, no peritoneal signs.  Labs obtained from triage and reviewed--does have leukocytosis, this may be from steroid injection.  No significant electrolyte derangement.  Lipase is normal.  Will give dose of IV fluids, Reglan, Benadryl.  Will reassess. ? ?3:18 AM ?Feeling much better after medications here.  She has been able to tolerate oral fluids without difficulty.  Abdomen remains soft and nontender.  Vitals remained stable.  Feel she is stable for discharge home.  Advised to rest, hydrate well, zofran PRN.  Close follow-up with PCP encouraged.  Return here for new/acute changes. ? ?Final Clinical Impression(s) / ED Diagnoses ?Final diagnoses:  ?Nausea and vomiting, unspecified vomiting type  ? ? ?Rx / DC Orders ?ED Discharge Orders   ? ?      Ordered  ?  ondansetron (ZOFRAN-ODT) 4 MG disintegrating tablet  Every 8 hours PRN       ? 04/08/22 0320  ? ?  ?  ? ?  ? ? ?  ?Larene Pickett, PA-C ?04/08/22 7416 ? ?  ?Delora Fuel, MD ?38/45/36 [phone] ? ?

## 2022-04-11 ENCOUNTER — Telehealth: Payer: Self-pay | Admitting: Physical Medicine and Rehabilitation

## 2022-04-11 ENCOUNTER — Telehealth: Payer: Self-pay

## 2022-04-11 NOTE — Telephone Encounter (Signed)
I called pt to let her know we have received FMLA paperwork but she still has to pay and fill out [employer].  ? ?CB 336 [phone]  ?

## 2022-04-11 NOTE — Telephone Encounter (Signed)
Transition Care Management Unsuccessful Follow-up Telephone Call ? ?Date of discharge and from where:  04/08/2022 from [facility] ? ?Attempts:  1st Attempt ? ?Reason for unsuccessful TCM follow-up call:  Left voice message ? ? ? ?

## 2022-04-11 NOTE — Telephone Encounter (Signed)
Pt called and states that she was out of work Thursday 4/20 for the injection and then had to go to the emergency room the next day. Since she was out of work her work needs some paperwork filled out. Wondering if you guys would be able to fill it out? ? ?CB [phone]  ?

## 2022-04-11 NOTE — Telephone Encounter (Signed)
[employer] forms received. To [employer] ?

## 2022-04-12 NOTE — Telephone Encounter (Signed)
Transition Care Management Unsuccessful Follow-up Telephone Call ? ?Date of discharge and from where:   04/08/2022 from [facility] ? ?Attempts:  2nd Attempt ? ?Reason for unsuccessful TCM follow-up call:  Left voice message ? ? ? ?

## 2022-04-13 NOTE — Telephone Encounter (Signed)
Transition Care Management Unsuccessful Follow-up Telephone Call ? ?Date of discharge and from where:  04/08/2022-[facility] ? ?Attempts:  3rd Attempt ? ?Reason for unsuccessful TCM follow-up call:  Left voice message ? ?  ?

## 2022-04-19 NOTE — Progress Notes (Signed)
? ?[name] - 46 y.o. female MRN [id number]  Date of birth: [birth year] ? ?Office Visit Note: ?Visit Date: 04/07/2022 ?PCP: Carlena Hurl, PA-C ?Referred by: Pete Pelt, PA-C ? ?Subjective: ?Chief Complaint  ?Patient presents with  ? Lower Back - Pain  ? Right Leg - Pain  ? ?HPI:  Dawn Goodwin is a 46 y.o. female who comes in today at the request of Benita Stabile, PA-C for planned Right L4-5 Lumbar Interlaminar epidural steroid injection with fluoroscopic guidance.  The patient has failed conservative care including home exercise, medications, time and activity modification.  This injection will be diagnostic and hopefully therapeutic.  Please see requesting physician notes for further details and justification. MRI reviewed with images and spine model.  MRI reviewed in the note below.  ? ?ROS Otherwise per HPI. ? ?Assessment & Plan: ?Visit Diagnoses:  ?  ICD-10-CM   ?1. Lumbar radiculopathy  M54.16 XR C-ARM NO REPORT  ?  Epidural Steroid injection  ?  methylPREDNISolone acetate (DEPO-MEDROL) injection 80 mg  ?  ?  ?Plan: No additional findings.  ? ?Meds & Orders:  ?Meds ordered this encounter  ?Medications  ? methylPREDNISolone acetate (DEPO-MEDROL) injection 80 mg  ?  ?Orders Placed This Encounter  ?Procedures  ? XR C-ARM NO REPORT  ? Epidural Steroid injection  ?  ?Follow-up: Return for visit to requesting provider as needed.  ? ?Procedures: ?No procedures performed  ?Lumbar Epidural Steroid Injection - Interlaminar Approach with Fluoroscopic Guidance ? ?Patient: Dawn Goodwin      ?Date of Birth: [birth year] ?MRN: [id number] ?PCP: Carlena Hurl, PA-C      ?Visit Date: 04/07/2022 ?  ?Universal Protocol:    ? ?Consent Given By: the patient ? ?Position: PRONE ? ?Additional Comments: ?Vital signs were monitored before and after the procedure. ?Patient was prepped and draped in the usual sterile fashion. ?The correct patient, procedure, and site was verified. ? ? ?Injection Procedure Details:   ? ?Procedure diagnoses: Lumbar radiculopathy [M54.16]  ? ?Meds Administered:  ?Meds ordered this encounter  ?Medications  ? methylPREDNISolone acetate (DEPO-MEDROL) injection 80 mg  ?  ? ?Laterality: Right ? ?Location/Site:  L4-5 ? ?Needle: 3.5 in., 20 ga. Tuohy ? ?Needle Placement: Paramedian epidural ? ?Findings:  ? -Comments: Excellent flow of contrast into the epidural space. ? ?Procedure Details: ?Using a paramedian approach from the side mentioned above, the region overlying the inferior lamina was localized under fluoroscopic visualization and the soft tissues overlying this structure were infiltrated with 4 ml. of 1% Lidocaine without Epinephrine. The Tuohy needle was inserted into the epidural space using a paramedian approach.  ? ?The epidural space was localized using loss of resistance along with counter oblique bi-planar fluoroscopic views.  After negative aspirate for air, blood, and CSF, a 2 ml. volume of Isovue-250 was injected into the epidural space and the flow of contrast was observed. Radiographs were obtained for documentation purposes.   ? ?The injectate was administered into the level noted above. ? ? ?Additional Comments:  ?No complications occurred ?Dressing: 2 x 2 sterile gauze and Band-Aid ?  ? ?Post-procedure details: ?Patient was observed during the procedure. ?Post-procedure instructions were reviewed. ? ?Patient left the clinic in stable condition.  ? ?Clinical History: ?MRI LUMBAR SPINE WITHOUT CONTRAST ?  ?TECHNIQUE: ?Multiplanar, multisequence MR imaging of the lumbar spine was ?performed. No intravenous contrast was administered. ?  ?COMPARISON:  X-ray 01/26/2022 ?  ?FINDINGS: ?Segmentation:  Standard. ?  ?Alignment: Straightening of the  cervical lordosis. Slight lumbar ?dextrocurvature. No static listhesis. ?  ?Vertebrae: No fracture, evidence of discitis, or bone lesion. ?Chronic mild discogenic endplate marrow changes at L4-5. ?  ?Conus medullaris and cauda equina: Conus  extends to the L1-2 level. ?Conus and cauda equina appear normal. ?  ?Paraspinal and other soft tissues: Negative. ?  ?Disc levels: ?  ?T12-L1: Unremarkable. ?  ?L1-L2: Unremarkable. ?  ?L2-L3: Unremarkable. ?  ?L3-L4: No disc protrusion. Mild bilateral facet arthropathy. No ?foraminal or canal stenosis. ?  ?L4-L5: Mild diffuse disc bulge with shallow right paracentral disc ?protrusion. Mild bilateral facet hypertrophy. Mild-moderate right ?subarticular recess stenosis without canal stenosis. No significant ?foraminal stenosis. ?  ?L5-S1: No disc protrusion. Mild bilateral facet arthropathy. No ?foraminal or canal stenosis. ?  ?IMPRESSION: ?1. Mild degenerative changes at L4-5 with mild-moderate right ?subarticular recess stenosis. Correlate for radicular symptoms in ?the right L5 nerve root distribution. ?2. No foraminal or canal stenosis at any level. ?  ?  ?Electronically Signed ?  By: Davina Poke D.O. ?  On: 02/25/2022 12:57  ? ? ? ?Objective:  VS:  HT:    WT:   BMI:     BP:   HR: bpm  TEMP: ( )  RESP:  ?Physical Exam ?Vitals and nursing note reviewed.  ?Constitutional:   ?   General: She is not in acute distress. ?   Appearance: Normal appearance. She is not ill-appearing.  ?HENT:  ?   Head: Normocephalic and atraumatic.  ?   Right Ear: External ear normal.  ?   Left Ear: External ear normal.  ?Eyes:  ?   Extraocular Movements: Extraocular movements intact.  ?Cardiovascular:  ?   Rate and Rhythm: Normal rate.  ?   Pulses: Normal pulses.  ?Pulmonary:  ?   Effort: Pulmonary effort is normal. No respiratory distress.  ?Abdominal:  ?   General: There is no distension.  ?   Palpations: Abdomen is soft.  ?Musculoskeletal:     ?   General: Tenderness present.  ?   Cervical back: Neck supple.  ?   Right lower leg: No edema.  ?   Left lower leg: No edema.  ?   Comments: Patient has good distal strength with no pain over the greater trochanters.  No clonus or focal weakness.  ?Skin: ?   Findings: No erythema,  lesion or rash.  ?Neurological:  ?   General: No focal deficit present.  ?   Mental Status: She is alert and oriented to person, place, and time.  ?   Sensory: No sensory deficit.  ?   Motor: No weakness or abnormal muscle tone.  ?   Coordination: Coordination normal.  ?Psychiatric:     ?   Mood and Affect: Mood normal.     ?   Behavior: Behavior normal.  ?  ? ?Imaging: ?No results found. ?

## 2022-04-19 NOTE — Procedures (Signed)
Lumbar Epidural Steroid Injection - Interlaminar Approach with Fluoroscopic Guidance ? ?Patient: Dawn Goodwin      ?Date of Birth: [birth year] ?MRN: [id number] ?PCP: Carlena Hurl, PA-C      ?Visit Date: 04/07/2022 ?  ?Universal Protocol:    ? ?Consent Given By: the patient ? ?Position: PRONE ? ?Additional Comments: ?Vital signs were monitored before and after the procedure. ?Patient was prepped and draped in the usual sterile fashion. ?The correct patient, procedure, and site was verified. ? ? ?Injection Procedure Details:  ? ?Procedure diagnoses: Lumbar radiculopathy [M54.16]  ? ?Meds Administered:  ?Meds ordered this encounter  ?Medications  ? methylPREDNISolone acetate (DEPO-MEDROL) injection 80 mg  ?  ? ?Laterality: Right ? ?Location/Site:  L4-5 ? ?Needle: 3.5 in., 20 ga. Tuohy ? ?Needle Placement: Paramedian epidural ? ?Findings:  ? -Comments: Excellent flow of contrast into the epidural space. ? ?Procedure Details: ?Using a paramedian approach from the side mentioned above, the region overlying the inferior lamina was localized under fluoroscopic visualization and the soft tissues overlying this structure were infiltrated with 4 ml. of 1% Lidocaine without Epinephrine. The Tuohy needle was inserted into the epidural space using a paramedian approach.  ? ?The epidural space was localized using loss of resistance along with counter oblique bi-planar fluoroscopic views.  After negative aspirate for air, blood, and CSF, a 2 ml. volume of Isovue-250 was injected into the epidural space and the flow of contrast was observed. Radiographs were obtained for documentation purposes.   ? ?The injectate was administered into the level noted above. ? ? ?Additional Comments:  ?No complications occurred ?Dressing: 2 x 2 sterile gauze and Band-Aid ?  ? ?Post-procedure details: ?Patient was observed during the procedure. ?Post-procedure instructions were reviewed. ? ?Patient left the clinic in stable condition. ?

## 2022-04-28 ENCOUNTER — Encounter (INDEPENDENT_AMBULATORY_CARE_PROVIDER_SITE_OTHER): Payer: Self-pay | Admitting: Family Medicine

## 2022-04-28 ENCOUNTER — Ambulatory Visit (INDEPENDENT_AMBULATORY_CARE_PROVIDER_SITE_OTHER): Payer: Managed Care, Other (non HMO) | Admitting: Family Medicine

## 2022-04-28 ENCOUNTER — Other Ambulatory Visit (INDEPENDENT_AMBULATORY_CARE_PROVIDER_SITE_OTHER): Payer: Self-pay | Admitting: Family Medicine

## 2022-04-28 VITALS — BP 114/74 | HR 93 | Temp 98.2°F | Ht 67.0 in | Wt 211.0 lb

## 2022-04-28 DIAGNOSIS — F3289 Other specified depressive episodes: Secondary | ICD-10-CM

## 2022-04-28 DIAGNOSIS — E669 Obesity, unspecified: Secondary | ICD-10-CM

## 2022-04-28 DIAGNOSIS — E559 Vitamin D deficiency, unspecified: Secondary | ICD-10-CM

## 2022-04-28 DIAGNOSIS — Z6833 Body mass index (BMI) 33.0-33.9, adult: Secondary | ICD-10-CM

## 2022-04-28 MED ORDER — TOPIRAMATE 25 MG PO CPSP: 25.0000 mg | ORAL_CAPSULE | Freq: Every day | ORAL | 0 refills | Status: DC

## 2022-05-09 ENCOUNTER — Telehealth (INDEPENDENT_AMBULATORY_CARE_PROVIDER_SITE_OTHER): Payer: Self-pay | Admitting: *Deleted

## 2022-05-09 DIAGNOSIS — F3289 Other specified depressive episodes: Secondary | ICD-10-CM

## 2022-05-09 MED ORDER — TOPIRAMATE 25 MG PO TABS: 25.0000 mg | ORAL_TABLET | Freq: Every day | ORAL | 0 refills | Status: DC

## 2022-05-09 NOTE — Telephone Encounter (Signed)
tablets

## 2022-05-09 NOTE — Telephone Encounter (Signed)
Pharmacy would like clarification if Topamax should be sprinkle capsules or tablets?

## 2022-05-12 NOTE — Progress Notes (Signed)
Chief Complaint:   OBESITY Dawn Goodwin is here to discuss her progress with her obesity treatment plan along with follow-up of her obesity related diagnoses. Dawn Goodwin is on the Category 2 Plan and states she is following her eating plan approximately 50% of the time. Dawn Goodwin states she is bike riding and using the punching bag for 30 minutes 3 times per week.  Today's visit was #: 2 Starting weight: 208 lbs Starting date: 03/30/2022 Today's weight: 211 lbs Today's date: 04/28/2022 Total lbs lost to date: 0 Total lbs lost since last in-office visit: 0  Interim History: Dawn Goodwin tried to follow her Category 2 plan but she made some significant deviations, although she tried to make healthier choices.   Subjective:   1. Vitamin D deficiency Dawn Goodwin was recently started on Vitamin D 50,000 IU weekly by her PCP. Recent labs were low and she notes fatigue.   2. Depression with emotional eating  Dawn Goodwin is working on her emotional eating behaviors. She has a history of seizures when she was younger, so Wellbutrin is not a good option.   Assessment/Plan:   1. Vitamin D deficiency Dawn Goodwin will continue prescription Vitamin D 50,000 IU weekly as is, and we will recheck labs in 3 months.   2. Depression with emotional eating  Dawn Goodwin agreed to start Topamax 25 mg qhs with no refills. Behavior modification techniques were discussed today to help Dawn Goodwin deal with her emotional/non-hunger eating behaviors.  Orders and follow up as documented in patient record.    3. Obesity, Current BMI 33.1 Dawn Goodwin is currently in the action stage of change. As such, her goal is to continue with weight loss efforts. She has agreed to keeping a food journal and adhering to recommended goals of 1100-1300 calories and 80+ grams of protein daily.   Exercise goals: As is.   Behavioral modification strategies: increasing lean protein intake.  Dawn Goodwin has agreed to follow-up with our clinic in 2 to 3 weeks. She  was informed of the importance of frequent follow-up visits to maximize her success with intensive lifestyle modifications for her multiple health conditions.   Objective:   Blood pressure 114/74, pulse 93, temperature 98.2 F (36.8 C), height 5' 7"  (1.702 m), weight 211 lb (95.7 kg), SpO2 98 %. Body mass index is 33.05 kg/m.  General: Cooperative, alert, well developed, in no acute distress. HEENT: Conjunctivae and lids unremarkable. Cardiovascular: Regular rhythm.  Lungs: Normal work of breathing. Neurologic: No focal deficits.   Lab Results  Component Value Date   CREATININE 0.89 04/07/2022   BUN 12 04/07/2022   NA 139 04/07/2022   K 3.8 04/07/2022   CL 107 04/07/2022   CO2 25 04/07/2022   Lab Results  Component Value Date   ALT 23 04/07/2022   AST 30 04/07/2022   ALKPHOS 81 04/07/2022   BILITOT 0.4 04/07/2022   Lab Results  Component Value Date   HGBA1C 5.2 03/30/2022   HGBA1C 5.1 11/26/2021   HGBA1C 5.1 10/04/2018   HGBA1C 5.0 10/03/2017   HGBA1C 5.0 08/25/2016   Lab Results  Component Value Date   INSULIN 4.3 03/30/2022   Lab Results  Component Value Date   TSH 0.695 03/30/2022   Lab Results  Component Value Date   CHOL 159 03/30/2022   HDL 49 03/30/2022   LDLCALC 101 (H) 03/30/2022   TRIG 43 03/30/2022   CHOLHDL 2.9 11/26/2021   Lab Results  Component Value Date   VD25OH 27.3 (L) 03/30/2022   VD25OH  38.1 11/05/2020   VD25OH 33.5 10/04/2018   Lab Results  Component Value Date   WBC 15.9 (H) 04/07/2022   HGB 12.3 04/07/2022   HCT 37.2 04/07/2022   MCV 91.0 04/07/2022   PLT 251 04/07/2022   Lab Results  Component Value Date   IRON 151 11/05/2020   Attestation Statements:   Reviewed by clinician on day of visit: allergies, medications, problem list, medical history, surgical history, family history, social history, and previous encounter notes.  Time spent on visit including pre-visit chart review and post-visit care and charting was 42  minutes.    I, Trixie Dredge, am acting as transcriptionist for Dennard Nip, MD.  I have reviewed the above documentation for accuracy and completeness, and I agree with the above. -  Dennard Nip, MD

## 2022-05-19 ENCOUNTER — Ambulatory Visit (INDEPENDENT_AMBULATORY_CARE_PROVIDER_SITE_OTHER): Payer: Managed Care, Other (non HMO) | Admitting: Bariatrics

## 2022-06-07 ENCOUNTER — Ambulatory Visit (INDEPENDENT_AMBULATORY_CARE_PROVIDER_SITE_OTHER): Payer: Self-pay | Admitting: Bariatrics

## 2022-07-26 ENCOUNTER — Ambulatory Visit (INDEPENDENT_AMBULATORY_CARE_PROVIDER_SITE_OTHER): Payer: Managed Care, Other (non HMO) | Admitting: Adult Health

## 2022-07-26 ENCOUNTER — Encounter (INDEPENDENT_AMBULATORY_CARE_PROVIDER_SITE_OTHER): Payer: Self-pay | Admitting: Adult Health

## 2022-07-26 VITALS — BP 133/84 | HR 82 | Ht 67.0 in | Wt 209.0 lb

## 2022-07-26 DIAGNOSIS — E559 Vitamin D deficiency, unspecified: Secondary | ICD-10-CM

## 2022-07-26 DIAGNOSIS — F3289 Other specified depressive episodes: Secondary | ICD-10-CM | POA: Diagnosis not present

## 2022-07-26 DIAGNOSIS — Z6832 Body mass index (BMI) 32.0-32.9, adult: Secondary | ICD-10-CM | POA: Diagnosis not present

## 2022-07-26 DIAGNOSIS — E669 Obesity, unspecified: Secondary | ICD-10-CM

## 2022-07-27 ENCOUNTER — Encounter (INDEPENDENT_AMBULATORY_CARE_PROVIDER_SITE_OTHER): Payer: Self-pay

## 2022-07-27 NOTE — Progress Notes (Unsigned)
Chief Complaint:   OBESITY Dawn Goodwin is here to discuss her progress with her obesity treatment plan along with follow-up of her obesity related diagnoses. Dawn Goodwin is on keeping a food journal and adhering to recommended goals of 1100-1300 calories and 80+ grams of protein and states she is following her eating plan approximately 60% of the time. Dawn Goodwin states she is cycling 30 minutes 1-3 times per week.  Today's visit was #: 3 Starting weight: 208 lbs Starting date: 03/30/2022 Today's weight: 209 lbs Today's date: 07/26/2022 Total lbs lost to date: 0 lbs Total lbs lost since last in-office visit: 2  Interim History: Dawn Goodwin was started on Topamax 25 mg at night at last office visit was 04/28/22. She used nightly 2 weeks--increased hunger. Stopped since the end of May 2023. This is her 3rd office with [facility]--last office visit (2nd visit 04/28/22). She brought in her expired bottle of Phentermine ***.  Subjective:   1. Vitamin D deficiency Dawn Goodwin's PCP provides prescription of Ergocalciferol.  2. Depression with emotional eating  ***  Assessment/Plan:   1. Vitamin D deficiency Dawn Goodwin will follow up with PCP.  2. Depression with emotional eating  Dawn Goodwin will remain off Topamax.  3. Obesity, Current BMI 32.7 Dawn Goodwin is currently in the action stage of change. As such, her goal is to continue with weight loss efforts. She has agreed to the Category 2 Plan.   Exercise goals: As is.  Behavioral modification strategies: increasing lean protein intake, decreasing simple carbohydrates, meal planning and cooking strategies, keeping healthy foods in the home, and planning for success.  Dawn Goodwin has agreed to follow-up with our clinic in 3 weeks. She was informed of the importance of frequent follow-up visits to maximize her success with intensive lifestyle modifications for her multiple health conditions.   Objective:   There were no vitals taken for this  visit. There is no height or weight on file to calculate BMI.  General: Cooperative, alert, well developed, in no acute distress. HEENT: Conjunctivae and lids unremarkable. Cardiovascular: Regular rhythm.  Lungs: Normal work of breathing. Neurologic: No focal deficits.   Lab Results  Component Value Date   CREATININE 0.89 04/07/2022   BUN 12 04/07/2022   NA 139 04/07/2022   K 3.8 04/07/2022   CL 107 04/07/2022   CO2 25 04/07/2022   Lab Results  Component Value Date   ALT 23 04/07/2022   AST 30 04/07/2022   ALKPHOS 81 04/07/2022   BILITOT 0.4 04/07/2022   Lab Results  Component Value Date   HGBA1C 5.2 03/30/2022   HGBA1C 5.1 11/26/2021   HGBA1C 5.1 10/04/2018   HGBA1C 5.0 10/03/2017   HGBA1C 5.0 08/25/2016   Lab Results  Component Value Date   INSULIN 4.3 03/30/2022   Lab Results  Component Value Date   TSH 0.695 03/30/2022   Lab Results  Component Value Date   CHOL 159 03/30/2022   HDL 49 03/30/2022   LDLCALC 101 (H) 03/30/2022   TRIG 43 03/30/2022   CHOLHDL 2.9 11/26/2021   Lab Results  Component Value Date   VD25OH 27.3 (L) 03/30/2022   VD25OH 38.1 11/05/2020   VD25OH 33.5 10/04/2018   Lab Results  Component Value Date   WBC 15.9 (H) 04/07/2022   HGB 12.3 04/07/2022   HCT 37.2 04/07/2022   MCV 91.0 04/07/2022   PLT 251 04/07/2022   Lab Results  Component Value Date   IRON 151 11/05/2020   Attestation Statements:  Reviewed by clinician on day of visit: allergies, medications, problem list, medical history, surgical history, family history, social history, and previous encounter notes.  Time spent on visit including pre-visit chart review and post-visit care and charting was 28 minutes.   I, Bettylee Feig, RMA, am acting as transcriptionist for William Hamburger, NP.  I have reviewed the above documentation for accuracy and completeness, and I agree with the above. -  ***

## 2022-08-09 ENCOUNTER — Ambulatory Visit (INDEPENDENT_AMBULATORY_CARE_PROVIDER_SITE_OTHER): Payer: Managed Care, Other (non HMO) | Admitting: Medical

## 2022-08-09 VITALS — BP 130/80 | HR 85 | Wt 211.6 lb

## 2022-08-09 DIAGNOSIS — N926 Irregular menstruation, unspecified: Secondary | ICD-10-CM

## 2022-08-09 DIAGNOSIS — E559 Vitamin D deficiency, unspecified: Secondary | ICD-10-CM | POA: Diagnosis not present

## 2022-08-09 DIAGNOSIS — G8929 Other chronic pain: Secondary | ICD-10-CM

## 2022-08-09 DIAGNOSIS — M25511 Pain in right shoulder: Secondary | ICD-10-CM | POA: Diagnosis not present

## 2022-08-09 NOTE — Progress Notes (Signed)
Subjective:  Dawn Goodwin is a 46 y.o. female who presents for Chief Complaint  Patient presents with   med check    Med check- discuss vitamin d, might be going through menopausal- periods have been skipping since last year Declines flu shot today will wait until fall     Right arm hurting when she gets up in the morning, feels heavy.   No recent injury or trauma.  Over the last few years she had worked with a Psychologist, educational and was doing regular weightlifting exercises.  She denies any recent injury or trauma.  She does note right arm hurts when sleeping, right shoulder pain regularly.  No arm numbness or tingling or weakness.  Since last year menstrual cycle has been and off.   LMP probably May 2023.   No cycle since June.   Did similar last year.  Concern about menopause.   No hot flashes.  No irritably.  Tired a lot but not sleeping well.   Not currently on HRT or birth control.  No concern for pregnancy currently  Vitamin D deficiency-Finished vitamin D supplement.  Did 3 months of vitamin D supplement 2,000 units daily.   Finished supplement a month ago.   Been going to pool in neighborhood, so getting some sun exposure.    No other aggravating or relieving factors.    No other c/o.  The following portions of the patient's history were reviewed and updated as appropriate: allergies, current medications, past family history, past medical history, past social history, past surgical history and problem list.  ROS Otherwise as in subjective above  Objective: BP 130/80   Pulse 85   Wt 211 lb 9.6 oz (96 kg)   BMI 33.14 kg/m   General appearance: alert, no distress, well developed, well nourished Neck: supple, no lymphadenopathy, no thyromegaly, no masses, normal range of motion Right shoulder nontender to palpation, she does seem to have some pain with active shoulder abduction and flexion, she has pain on resisted abduction and flexion and empty can test, otherwise no laxity no other  swelling or deformity.  She does have slight decreased internal range of motion of the right shoulder. Arms neurovascularly intact  pulses: 2+ radial pulses, 2+ pedal pulses, normal cap refill Ext: no edema   Assessment: Encounter Diagnoses  Name Primary?   Vitamin D deficiency Yes   Menstrual cycle problem    Chronic right shoulder pain      Plan: Vitamin D deficiency-updated labs today, she completed 3 months of supplement ending about a month ago  Menstrual cycles-updated labs today.  Discussed possible differential, possible perimenopausal  Chronic right shoulder pain-I advised 10 days of relative rest, use of arm sling periodically, cold therapy and Aleve over-the-counter.  After 10 days if improving with the pain I gave her some exercises and some strengthening exercises to start doing regularly.  Plan to follow-up in the next 3 weeks or so.  We will potentially need to refer to sports medicine if not improving pending labs  Roshonda was seen today for med check.  Diagnoses and all orders for this visit:  Vitamin D deficiency -     VITAMIN D 25 Hydroxy (Vit-D Deficiency, Fractures)  Menstrual cycle problem -     FSH/LH -     Estrogens, Total -     Beta hCG quant (ref lab)  Chronic right shoulder pain    Follow up: pending labs

## 2022-08-10 ENCOUNTER — Ambulatory Visit (INDEPENDENT_AMBULATORY_CARE_PROVIDER_SITE_OTHER): Payer: Managed Care, Other (non HMO) | Admitting: Adult Health

## 2022-08-10 ENCOUNTER — Encounter (INDEPENDENT_AMBULATORY_CARE_PROVIDER_SITE_OTHER): Payer: Self-pay | Admitting: Adult Health

## 2022-08-10 VITALS — BP 128/83 | HR 79 | Temp 98.0°F | Ht 67.0 in | Wt 209.0 lb

## 2022-08-10 DIAGNOSIS — E669 Obesity, unspecified: Secondary | ICD-10-CM | POA: Diagnosis not present

## 2022-08-10 DIAGNOSIS — Z6832 Body mass index (BMI) 32.0-32.9, adult: Secondary | ICD-10-CM

## 2022-08-10 DIAGNOSIS — E559 Vitamin D deficiency, unspecified: Secondary | ICD-10-CM

## 2022-08-10 MED ORDER — VITAMIN D (ERGOCALCIFEROL) 1.25 MG (50000 UNIT) PO CAPS: 50000.0000 [IU] | ORAL_CAPSULE | ORAL | 0 refills | Status: DC

## 2022-08-12 LAB — ESTROGENS, TOTAL: Estrogen: 87 pg/mL

## 2022-08-12 LAB — BETA HCG QUANT (REF LAB): hCG Quant: <1 m[IU]/mL

## 2022-08-12 LAB — FSH/LH
FSH: 52.8 m[IU]/mL
LH: 40.4 m[IU]/mL

## 2022-08-12 LAB — VITAMIN D 25 HYDROXY (VIT D DEFICIENCY, FRACTURES): Vit D, 25-Hydroxy: 38.7 ng/mL (ref 30.0–100.0)

## 2022-08-13 DIAGNOSIS — E559 Vitamin D deficiency, unspecified: Secondary | ICD-10-CM | POA: Insufficient documentation

## 2022-08-13 NOTE — Progress Notes (Unsigned)
Chief Complaint:   OBESITY Dawn Goodwin is here to discuss her progress with her obesity treatment plan along with follow-up of her obesity related diagnoses. Dawn Goodwin is on the Category 2 Plan and states she is following her eating plan approximately 60% of the time. Dawn Goodwin states she is bike and walking 60 minutes 3 times per week.  Today's visit was #: 4 Starting weight: 208 lbs Starting date: 03/30/2022 Today's weight: 209 lbs Today's date: 08/13/2022 Total lbs lost to date: 0 Total lbs lost since last in-office visit: 0  Interim History: She works 3rd shift.  She works 5-6 days per week, 11PM-7AM, Asleep 4 pm-9 pm.  Subjective:   1. Vitamin D deficiency Discussed labs with patient today. 08/09/22, Vitamin D level 38.7, goal is 50-70. Assessment/Plan:   1. Vitamin D deficiency Refill - Vitamin D, Ergocalciferol, (DRISDOL) 1.25 MG (50000 UNIT) CAPS capsule; Take 1 capsule (50,000 Units total) by mouth every 7 (seven) days.  Dispense: 8 capsule; Refill: 0  2. Obesity, Current BMI 32.8 Breakfast 10 AM Lunch 2-3 PM Dinner protein shake  Dawn Goodwin is currently in the action stage of change. As such, her goal is to continue with weight loss efforts. She has agreed to the Category 2 Plan.   Exercise goals:  As is.   Behavioral modification strategies: increasing lean protein intake, decreasing simple carbohydrates, meal planning and cooking strategies, keeping healthy foods in the home, and planning for success.  Dawn Goodwin has agreed to follow-up with our clinic in 2 weeks. She was informed of the importance of frequent follow-up visits to maximize her success with intensive lifestyle modifications for her multiple health conditions.   Objective:   Blood pressure 128/83, pulse 79, temperature 98 F (36.7 C), height 5\' 7"  (1.702 m), weight 209 lb (94.8 kg), SpO2 99 %. Body mass index is 32.73 kg/m.  General: Cooperative, alert, well developed, in no acute distress. HEENT:  Conjunctivae and lids unremarkable. Cardiovascular: Regular rhythm.  Lungs: Normal work of breathing. Neurologic: No focal deficits.   Lab Results  Component Value Date   CREATININE 0.89 04/07/2022   BUN 12 04/07/2022   NA 139 04/07/2022   K 3.8 04/07/2022   CL 107 04/07/2022   CO2 25 04/07/2022   Lab Results  Component Value Date   ALT 23 04/07/2022   AST 30 04/07/2022   ALKPHOS 81 04/07/2022   BILITOT 0.4 04/07/2022   Lab Results  Component Value Date   HGBA1C 5.2 03/30/2022   HGBA1C 5.1 11/26/2021   HGBA1C 5.1 10/04/2018   HGBA1C 5.0 10/03/2017   HGBA1C 5.0 08/25/2016   Lab Results  Component Value Date   INSULIN 4.3 03/30/2022   Lab Results  Component Value Date   TSH 0.695 03/30/2022   Lab Results  Component Value Date   CHOL 159 03/30/2022   HDL 49 03/30/2022   LDLCALC 101 (H) 03/30/2022   TRIG 43 03/30/2022   CHOLHDL 2.9 11/26/2021   Lab Results  Component Value Date   VD25OH 38.7 08/09/2022   VD25OH 27.3 (L) 03/30/2022   VD25OH 38.1 11/05/2020   Lab Results  Component Value Date   WBC 15.9 (H) 04/07/2022   HGB 12.3 04/07/2022   HCT 37.2 04/07/2022   MCV 91.0 04/07/2022   PLT 251 04/07/2022   Lab Results  Component Value Date   IRON 151 11/05/2020   Attestation Statements:   Reviewed by clinician on day of visit: allergies, medications, problem list, medical history, surgical history, family  history, social history, and previous encounter notes.  I, Davy Pique, RMA, am acting as Location manager for Mina Marble, NP.  I have reviewed the above documentation for accuracy and completeness, and I agree with the above. -  ***

## 2022-08-31 ENCOUNTER — Encounter (INDEPENDENT_AMBULATORY_CARE_PROVIDER_SITE_OTHER): Payer: Self-pay | Admitting: Adult Health

## 2022-08-31 ENCOUNTER — Ambulatory Visit (INDEPENDENT_AMBULATORY_CARE_PROVIDER_SITE_OTHER): Payer: Managed Care, Other (non HMO) | Admitting: Adult Health

## 2022-08-31 VITALS — BP 132/92 | HR 75 | Temp 98.1°F | Ht 67.0 in | Wt 213.0 lb

## 2022-08-31 DIAGNOSIS — E669 Obesity, unspecified: Secondary | ICD-10-CM | POA: Diagnosis not present

## 2022-08-31 DIAGNOSIS — E559 Vitamin D deficiency, unspecified: Secondary | ICD-10-CM

## 2022-08-31 DIAGNOSIS — Z6833 Body mass index (BMI) 33.0-33.9, adult: Secondary | ICD-10-CM | POA: Diagnosis not present

## 2022-08-31 MED ORDER — VITAMIN D (ERGOCALCIFEROL) 1.25 MG (50000 UNIT) PO CAPS: 50000.0000 [IU] | ORAL_CAPSULE | ORAL | 0 refills | Status: DC

## 2022-09-01 NOTE — Progress Notes (Unsigned)
Chief Complaint:   OBESITY Allani is here to discuss her progress with her obesity treatment plan along with follow-up of her obesity related diagnoses. Malashia is on the Category 2 Plan and states she is following her eating plan approximately 50% of the time. Adyline states she is walking 30 minutes 3 times per week.  Today's visit was #: 5 Starting weight: 208 lbs Starting date: 03/30/2022 Today's weight: 213 lbs Today's date: 08/31/2022 Total lbs lost to date: 0 Total lbs lost since last in-office visit: +4 lbs  Interim History: she is drinking 2 protein shakes a day.  She will often have multiple servings of dinner. She endorses enjoying protein and vegetable.   Of note:  her son is a Arts administrator at [employer].   Subjective:   1. Vitamin D deficiency Discussed labs with patient today. 08/09/2022, Vitamin D level 38.7. Below of 50-70.  Assessment/Plan:   1. Vitamin D deficiency Refill - Vitamin D, Ergocalciferol, (DRISDOL) 1.25 MG (50000 UNIT) CAPS capsule; Take 1 capsule (50,000 Units total) by mouth every 7 (seven) days.  Dispense: 8 capsule; Refill: 0  2. Obesity, Current BMI 33.5 Shaiann is currently in the action stage of change. As such, her goal is to continue with weight loss efforts. She has agreed to following a lower carbohydrate, vegetable and lean protein rich diet plan.   Exercise goals:  as is.  Behavioral modification strategies: increasing lean protein intake, decreasing simple carbohydrates, meal planning and cooking strategies, keeping healthy foods in the home, and planning for success.  Amillion has agreed to follow-up with our clinic in 4 weeks. She was informed of the importance of frequent follow-up visits to maximize her success with intensive lifestyle modifications for her multiple health conditions.   Objective:   Blood pressure (!) 132/92, pulse 75, temperature 98.1 F (36.7 C), height 5\' 7"  (1.702 m), weight 213 lb (96.6 kg), SpO2 98  %. Body mass index is 33.36 kg/m.  General: Cooperative, alert, well developed, in no acute distress. HEENT: Conjunctivae and lids unremarkable. Cardiovascular: Regular rhythm.  Lungs: Normal work of breathing. Neurologic: No focal deficits.   Lab Results  Component Value Date   CREATININE 0.89 04/07/2022   BUN 12 04/07/2022   NA 139 04/07/2022   K 3.8 04/07/2022   CL 107 04/07/2022   CO2 25 04/07/2022   Lab Results  Component Value Date   ALT 23 04/07/2022   AST 30 04/07/2022   ALKPHOS 81 04/07/2022   BILITOT 0.4 04/07/2022   Lab Results  Component Value Date   HGBA1C 5.2 03/30/2022   HGBA1C 5.1 11/26/2021   HGBA1C 5.1 10/04/2018   HGBA1C 5.0 10/03/2017   HGBA1C 5.0 08/25/2016   Lab Results  Component Value Date   INSULIN 4.3 03/30/2022   Lab Results  Component Value Date   TSH 0.695 03/30/2022   Lab Results  Component Value Date   CHOL 159 03/30/2022   HDL 49 03/30/2022   LDLCALC 101 (H) 03/30/2022   TRIG 43 03/30/2022   CHOLHDL 2.9 11/26/2021   Lab Results  Component Value Date   VD25OH 38.7 08/09/2022   VD25OH 27.3 (L) 03/30/2022   VD25OH 38.1 11/05/2020   Lab Results  Component Value Date   WBC 15.9 (H) 04/07/2022   HGB 12.3 04/07/2022   HCT 37.2 04/07/2022   MCV 91.0 04/07/2022   PLT 251 04/07/2022   Lab Results  Component Value Date   IRON 151 11/05/2020  Attestation Statements:   Reviewed by clinician on day of visit: allergies, medications, problem list, medical history, surgical history, family history, social history, and previous encounter notes.  I, Malcolm Metro, RMA, am acting as Energy manager for William Hamburger, NP.  I have reviewed the above documentation for accuracy and completeness, and I agree with the above. -  ***

## 2022-09-08 ENCOUNTER — Telehealth: Payer: Self-pay

## 2022-09-08 NOTE — Telephone Encounter (Signed)
I have printed forms and put in shane'sfolder to look at

## 2022-09-08 NOTE — Telephone Encounter (Signed)
Pt. Called stating she emailed you a form for her fmla. It used to be filled out for 3 days per month for her to take her son Madalin Hughart to his apts. And to be home with him when he is sick. She said the last one that was filled out for the last 6 months only said 1 day per month. She stated she had to miss 3 days this month to stay at home with Shaun because he was sick and had covid like symptoms. She wanted to know if you could have Audelia Acton fill this new one out but with 3 days per month like she used to get on her FMLA.

## 2022-09-09 NOTE — Telephone Encounter (Signed)
I have sent back to [facility] by email and faxed over copy to her work

## 2022-09-27 ENCOUNTER — Encounter: Payer: Self-pay | Admitting: Internal Medicine

## 2022-09-28 ENCOUNTER — Ambulatory Visit (INDEPENDENT_AMBULATORY_CARE_PROVIDER_SITE_OTHER): Payer: Managed Care, Other (non HMO) | Admitting: Adult Health

## 2022-09-28 ENCOUNTER — Encounter (INDEPENDENT_AMBULATORY_CARE_PROVIDER_SITE_OTHER): Payer: Self-pay | Admitting: Adult Health

## 2022-09-28 VITALS — BP 122/80 | HR 93 | Temp 98.5°F | Ht 67.0 in | Wt 211.0 lb

## 2022-09-28 DIAGNOSIS — E559 Vitamin D deficiency, unspecified: Secondary | ICD-10-CM

## 2022-09-28 DIAGNOSIS — E669 Obesity, unspecified: Secondary | ICD-10-CM | POA: Diagnosis not present

## 2022-09-28 DIAGNOSIS — Z6833 Body mass index (BMI) 33.0-33.9, adult: Secondary | ICD-10-CM

## 2022-09-28 DIAGNOSIS — R5383 Other fatigue: Secondary | ICD-10-CM

## 2022-09-28 MED ORDER — VITAMIN D (ERGOCALCIFEROL) 1.25 MG (50000 UNIT) PO CAPS: 50000.0000 [IU] | ORAL_CAPSULE | ORAL | 0 refills | Status: DC

## 2022-10-04 NOTE — Progress Notes (Signed)
Chief Complaint:   OBESITY Dawn Goodwin is here to discuss her progress with her obesity treatment plan along with follow-up of her obesity related diagnoses. Dawn Goodwin is on following a lower carbohydrate, vegetable and lean protein rich diet plan and states she is following her eating plan approximately 100% of the time. Dawn Goodwin states she is biking and walking 30 minutes 3 times per week.  Today's visit was #: 6 Starting weight: 208 lbs Starting date: 03/30/2022 Today's weight: 211 lbs Today's date: 09/28/2022 Total lbs lost to date: 0 Total lbs lost since last in-office visit: 2 lbs  Interim History:  Her daily schedule:   11 PM-0730 AM-Works for [employer], she is an Geophysicist/field seismologist 5, her primary job responsibilities are to [employer].  She returns home after shift and assists gets her son ready for school, then drops him off for school.   She exercises, she eats, shower and sleeps 1030-1330.  Picks up her son at 60.   She eats dinner, iron clothes then sleeps from 1730-2130.   Up at 2130 gets ready for work.   Her sister stays with her son when she works.   Subjective:   1. Vitamin D deficiency 08/09/2022, Vitamin D level 38.7, below goal of 50-70.   She endorses fatigue, estimates about 4 hours of sleep in a 24 hour period.   2. Other fatigue She endorses persistent fatigue r/t to her demanding schedule. She denies depression or acute cardiac sx's. She is hoping to change from night to day shift [employer]) Spring 2025.  Assessment/Plan:   1. Vitamin D deficiency Refill - Vitamin D, Ergocalciferol, (DRISDOL) 1.25 MG (50000 UNIT) CAPS capsule; Take 1 capsule (50,000 Units total) by mouth every 7 (seven) days.  Dispense: 8 capsule; Refill: 0  2. Other fatigue She will be changing to 1st shift after her son graduates. He will graduate May of 2025.  3. Obesity, Current BMI 33.1 Dawn Goodwin is currently in the action stage of change. As such, her goal is to continue with  weight loss efforts. She has agreed to following a lower carbohydrate, vegetable and lean protein rich diet plan.   Exercise goals:  As is.   Behavioral modification strategies: increasing lean protein intake, decreasing simple carbohydrates, increasing vegetables, meal planning and cooking strategies, keeping healthy foods in the home, and planning for success.  Dawn Goodwin has agreed to follow-up with our clinic in 4 weeks. She was informed of the importance of frequent follow-up visits to maximize her success with intensive lifestyle modifications for her multiple health conditions.   Objective:   Blood pressure 122/80, pulse 93, temperature 98.5 F (36.9 C), height 5\' 7"  (1.702 m), weight 211 lb (95.7 kg), SpO2 100 %. Body mass index is 33.05 kg/m.  General: Cooperative, alert, well developed, in no acute distress. HEENT: Conjunctivae and lids unremarkable. Cardiovascular: Regular rhythm.  Lungs: Normal work of breathing. Neurologic: No focal deficits.   Lab Results  Component Value Date   CREATININE 0.89 04/07/2022   BUN 12 04/07/2022   NA 139 04/07/2022   K 3.8 04/07/2022   CL 107 04/07/2022   CO2 25 04/07/2022   Lab Results  Component Value Date   ALT 23 04/07/2022   AST 30 04/07/2022   ALKPHOS 81 04/07/2022   BILITOT 0.4 04/07/2022   Lab Results  Component Value Date   HGBA1C 5.2 03/30/2022   HGBA1C 5.1 11/26/2021   HGBA1C 5.1 10/04/2018   HGBA1C 5.0 10/03/2017   HGBA1C 5.0  08/25/2016   Lab Results  Component Value Date   INSULIN 4.3 03/30/2022   Lab Results  Component Value Date   TSH 0.695 03/30/2022   Lab Results  Component Value Date   CHOL 159 03/30/2022   HDL 49 03/30/2022   LDLCALC 101 (H) 03/30/2022   TRIG 43 03/30/2022   CHOLHDL 2.9 11/26/2021   Lab Results  Component Value Date   VD25OH 38.7 08/09/2022   VD25OH 27.3 (L) 03/30/2022   VD25OH 38.1 11/05/2020   Lab Results  Component Value Date   WBC 15.9 (H) 04/07/2022   HGB 12.3  04/07/2022   HCT 37.2 04/07/2022   MCV 91.0 04/07/2022   PLT 251 04/07/2022   Lab Results  Component Value Date   IRON 151 11/05/2020   Attestation Statements:   Reviewed by clinician on day of visit: allergies, medications, problem list, medical history, surgical history, family history, social history, and previous encounter notes.  I, Davy Pique, RMA, am acting as Location manager for Mina Marble, NP.  I have reviewed the above documentation for accuracy and completeness, and I agree with the above. -  Dawn Goodwin d. Dawn Uffelman, NP-C

## 2022-10-27 ENCOUNTER — Ambulatory Visit (INDEPENDENT_AMBULATORY_CARE_PROVIDER_SITE_OTHER): Payer: Managed Care, Other (non HMO) | Admitting: Family Medicine

## 2022-10-31 ENCOUNTER — Ambulatory Visit (INDEPENDENT_AMBULATORY_CARE_PROVIDER_SITE_OTHER): Payer: Managed Care, Other (non HMO) | Admitting: Family Medicine

## 2022-11-25 ENCOUNTER — Ambulatory Visit (INDEPENDENT_AMBULATORY_CARE_PROVIDER_SITE_OTHER): Payer: Managed Care, Other (non HMO) | Admitting: Medical

## 2022-11-25 DIAGNOSIS — Z20828 Contact with and (suspected) exposure to other viral communicable diseases: Secondary | ICD-10-CM

## 2023-01-09 MED ORDER — OSELTAMIVIR PHOSPHATE 75 MG PO CAPS: 75.0000 mg | ORAL_CAPSULE | Freq: Every day | ORAL | 0 refills | Status: DC

## 2023-01-09 NOTE — Progress Notes (Signed)
Phone contact

## 2023-01-15 ENCOUNTER — Other Ambulatory Visit (INDEPENDENT_AMBULATORY_CARE_PROVIDER_SITE_OTHER): Payer: Self-pay | Admitting: Adult Health

## 2023-01-15 DIAGNOSIS — E559 Vitamin D deficiency, unspecified: Secondary | ICD-10-CM

## 2023-01-19 ENCOUNTER — Ambulatory Visit: Payer: Managed Care, Other (non HMO) | Admitting: Medical

## 2023-01-19 ENCOUNTER — Encounter: Payer: Self-pay | Admitting: Medical

## 2023-01-19 VITALS — BP 120/80 | HR 99 | Ht 67.5 in | Wt 219.4 lb

## 2023-01-19 DIAGNOSIS — E559 Vitamin D deficiency, unspecified: Secondary | ICD-10-CM

## 2023-01-19 DIAGNOSIS — Z13220 Encounter for screening for lipoid disorders: Secondary | ICD-10-CM

## 2023-01-19 DIAGNOSIS — Z23 Encounter for immunization: Secondary | ICD-10-CM

## 2023-01-19 DIAGNOSIS — Z0000 Encounter for general adult medical examination without abnormal findings: Secondary | ICD-10-CM | POA: Insufficient documentation

## 2023-01-19 DIAGNOSIS — Z113 Encounter for screening for infections with a predominantly sexual mode of transmission: Secondary | ICD-10-CM

## 2023-01-19 LAB — POCT URINALYSIS DIP (PROADVANTAGE DEVICE)
Glucose, UA: NEGATIVE mg/dL
Ketones, POC UA: NEGATIVE mg/dL
Leukocytes, UA: NEGATIVE
Nitrite, UA: NEGATIVE
Protein Ur, POC: NEGATIVE mg/dL
Specific Gravity, Urine: 1.025
Urobilinogen, Ur: NEGATIVE
pH, UA: 6 (ref 5.0–8.0)

## 2023-01-19 LAB — POCT UA - MICROSCOPIC ONLY: RBC, Urine, Miroscopic: <1 (ref 0–2)

## 2023-01-19 LAB — CBC

## 2023-01-19 NOTE — Progress Notes (Signed)
Subjective: Chief Complaint  Patient presents with   fasting cpe    Fasting cpe, no concerns, see obgyn, should she get flu shot since son has flu recently    Medical team: Dawn Goodwin, [facility]   Concerns: Exercise - doing some strength training.  Not a lot of cardio lately.  Has used personal trainer in the past.   Going through some perimenopausal.  LMP 10/2022  Gyn hx/o - 2 prior pregnancies, 1 live birth, 1 D&C.  No recent sexual activity, uses condoms.   Had some hot chocolate 3am, works third shift, otherwise fasting.   Currently working at [employer] using vit D supplement weekly, finished last dose last week.   Saw dermatology yesterday for routine surveillance.    Reviewed their medical, surgical, family, social, medication, and allergy history and updated chart as appropriate.  Past Medical History:  Diagnosis Date   Acne    Back pain 2002   went through PT    GERD (gastroesophageal reflux disease)    intermittent   Headache    History of UTI    once prior as of 03/2015   Knee pain 2007   right   Pregnancy induced hypertension    Seizure disorder ([place])    from infancy til age 35yo, on medication during that time, etiology unclear?   Wears glasses     Past Surgical History:  Procedure Laterality Date   CESAREAN SECTION  2006   DILATION AND CURETTAGE OF UTERUS  2013   DILATION AND EVACUATION  12/26/2011   Procedure: DILATATION AND EVACUATION;  Surgeon: Dawn Goodwin;  Location: [facility];  Service: Gynecology;  Laterality: N/A;    Social History   Socioeconomic History   Marital status: Single    Spouse name: Not on file   Number of children: Not on file   Years of education: Not on file   Highest education level: Not on file  Occupational History   Not on file  Tobacco Use   Smoking status: Never   Smokeless tobacco: Never  Vaping Use   Vaping Use: Never used  Substance  and Sexual Activity   Alcohol use: No   Drug use: No   Sexual activity: Not Currently  Other Topics Concern   Not on file  Social History Narrative   Exercise - prior trainer, using various exercise, HIT.   [employer], swimming.   [employer], lives at home with her son Dawn Goodwin who has Autism;  Working at [employer].  Her sister helps watch Dawn Goodwin in evenings until she is off.   4 siblings, 2 sisters and 1 brother. 01/2023   Social Determinants of Health   Financial Resource Strain: Not on file  Food Insecurity: Not on file  Transportation Needs: Not on file  Physical Activity: Not on file  Stress: Not on file  Social Connections: Not on file  Intimate Partner Violence: Not on file    Family History  Problem Relation Age of Onset   Hypertension Mother    Other Mother        total knee replacement   Arthritis Mother        knee replacement   Diabetes Father    Eczema Sister    Hypertension Maternal Grandmother    Diabetes Maternal Grandmother    Heart disease Maternal Grandmother        CHF   Alzheimer's disease Maternal Grandmother    Cancer Maternal Uncle  lung   Arthritis Cousin        knee replacement   Autism Son    Stroke Neg Hx      Current Outpatient Medications:    Vitamin D, Ergocalciferol, (DRISDOL) 1.25 MG (50000 UNIT) CAPS capsule, Take 1 capsule (50,000 Units total) by mouth every 7 (seven) days. (Patient not taking: Reported on 01/19/2023), Disp: 8 capsule, Rfl: 0  Allergies  Allergen Reactions   Cyclinex [Tetracycline Hcl] Swelling    Face, hands, feet    Review of Systems  Constitutional:  Negative for chills, fever, malaise/fatigue and weight loss.  HENT:  Negative for congestion, ear pain, hearing loss, sore throat and tinnitus.   Eyes:  Negative for blurred vision, pain and redness.  Respiratory:  Negative for cough, hemoptysis and shortness of breath.   Cardiovascular:  Negative for chest pain, palpitations, orthopnea, claudication and leg swelling.   Gastrointestinal:  Negative for abdominal pain, blood in stool, constipation, diarrhea, nausea and vomiting.  Genitourinary:  Negative for dysuria, flank pain, frequency, hematuria and urgency.  Musculoskeletal:  Negative for falls, joint pain and myalgias.  Skin:  Negative for itching and rash.  Neurological:  Negative for dizziness, tingling, speech change, weakness and headaches.  Endo/Heme/Allergies:  Negative for polydipsia. Does not bruise/bleed easily.  Psychiatric/Behavioral:  Negative for depression and memory loss. The patient is not nervous/anxious and does not have insomnia.        Objective:   Physical Exam  BP 120/80   Pulse 99   Ht 5' 7.5" (1.715 m)   Wt 219 lb 6.4 oz (99.5 kg)   BMI 33.86 kg/m    Wt Readings from Last 3 Encounters:  01/19/23 219 lb 6.4 oz (99.5 kg)  09/28/22 211 lb (95.7 kg)  08/31/22 213 lb (96.6 kg)   BP Readings from Last 3 Encounters:  01/19/23 120/80  09/28/22 122/80  08/31/22 (!) 132/92   General appearance: alert, no distress, WD/WN, AA female Skin: no worrisome lesions HEENT: normocephalic, conjunctiva/corneas normal, sclerae anicteric, PERRLA, EOMi Neck: supple, no lymphadenopathy, no thyromegaly, no masses, normal ROM, on bruits Chest: non tender, normal shape and expansion Heart: RRR, normal S1, S2, no murmurs Lungs: CTA bilaterally, no wheezes, rhonchi, or rales Abdomen: +bs, soft, non tender, non distended, no masses, no hepatomegaly, no splenomegaly, no bruits Back: non tender, normal ROM, no scoliosis Musculoskeletal: nontender, no swelling, no abnormality of knees or achilles, otherwise extremities non tender, no obvious deformity, normal ROM throughout, lower extremities non tender, no obvious deformity, normal ROM throughout Extremities: no edema, no cyanosis, no clubbing Pulses: 2+ symmetric, upper and lower extremities, normal cap refill Neurological: alert, oriented x 3, CN2-12 intact, strength normal upper extremities  and lower extremities, sensation normal throughout, DTRs 2+ throughout, no cerebellar signs, gait normal Psychiatric: normal affect, behavior normal, pleasant  Breast, pelvic, rectal-deferred   Assessment and Plan :    Encounter Diagnoses  Name Primary?   Routine general medical examination at a health care facility Yes   Need for influenza vaccination    Screen for STD (sexually transmitted disease)    Screening for lipid disorders    Vitamin D deficiency    Today you had a preventative care visit or wellness visit.    Topics today may have included healthy lifestyle, diet, exercise, preventative care, vaccinations, sick and well care, proper use of emergency dept and after hours care, as well as other concerns.    Recommendations: Continue to return yearly for your annual wellness and  preventative care visits.  This gives [place] a chance to discuss healthy lifestyle, exercise, vaccinations, review your chart record, and perform screenings where appropriate.  I recommend you see your eye doctor yearly for routine vision care.  I recommend you see your dentist yearly for routine dental care including hygiene visits twice yearly.  See your gynecologist yearly for routine gynecological care.   Vaccination recommendations were reviewed Immunization History  Administered Date(s) Administered   Hepatitis B, adult 08/05/2016, 10/05/2016, 02/06/2017   Influenza Split 09/22/2011, 08/07/2014   Influenza,inj,Quad PF,6+ Mos 12/23/2015, 08/25/2016, 10/03/2017, 10/05/2018, 10/08/2019, 11/26/2021, 01/19/2023   PPD Test 08/01/2016   Rho (D) Immune Globulin 12/25/2011   Tdap 04/15/2015   Counseled on the influenza virus vaccine.  Vaccine information sheet given.  Influenza vaccine given after consent obtained.   Screening for cancer: Breast cancer screening: You should perform a self breast exam monthly.    Colon cancer screening:  I reviewed cologuard negative from 11/2021.  Due repeat in  11/2024.  Cervical cancer screening: We reviewed recommendations for pap smear screening. Pap smear up-to-date 2021 reviewed  Skin cancer screening: Check your skin regularly for new changes, growing lesions, or other lesions of concern Come in for evaluation if you have skin lesions of concern.  Lung cancer screening: If you have a greater than 30 pack year history of tobacco use, then you qualify for lung cancer screening with a chest CT scan  We currently don't have screenings for other cancers besides breast, cervical, colon, and lung cancers.  If you have a strong family history of cancer or have other cancer screening concerns, please let me know.    Bone health: Get at least 150 minutes of aerobic exercise weekly Get weight bearing exercise at least once weekly   Heart health: Get at least 150 minutes of aerobic exercise weekly Limit alcohol It is important to maintain a healthy blood pressure and healthy cholesterol numbers   Separate significant issues discussed: Vitamin D deficient-updated labs today.  She just completed a prescription for 90 days for 50,000 units weekly dose.  She does not like that helped with her fatigue  She would like STD screening today  Continue efforts to lose weight through healthy diet and exercise.  Needs to do more cardio at this point  becoming perimenopausal.  I reviewed some labs we did this past year    Dawn Goodwin was seen today for fasting cpe.  Diagnoses and all orders for this visit:  Routine general medical examination at a health care facility -     POCT Urinalysis DIP (Proadvantage Device) -     Comprehensive metabolic panel -     CBC -     Lipid panel -     RPR+HIV+GC+CT Panel -     Hepatitis B surface antigen -     Hepatitis C antibody -     VITAMIN D 25 Hydroxy (Vit-D Deficiency, Fractures)  Need for influenza vaccination -     Flu Vaccine QUAD 54mo+IM (Fluarix, Fluzone & Alfiuria Quad PF)  Screen for STD (sexually  transmitted disease) -     RPR+HIV+GC+CT Panel -     Hepatitis B surface antigen -     Hepatitis C antibody  Screening for lipid disorders -     Lipid panel  Vitamin D deficiency -     VITAMIN D 25 Hydroxy (Vit-D Deficiency, Fractures)    F/u yearly for physical

## 2023-01-19 NOTE — Addendum Note (Signed)
Addended by: Minette Headland A on: 01/19/2023 12:13 PM   Modules accepted: Orders

## 2023-01-19 NOTE — Progress Notes (Signed)
Closed cologaurd gap

## 2023-01-20 ENCOUNTER — Other Ambulatory Visit: Payer: Self-pay | Admitting: Medical

## 2023-01-20 DIAGNOSIS — E559 Vitamin D deficiency, unspecified: Secondary | ICD-10-CM

## 2023-01-20 MED ORDER — VITAMIN D (ERGOCALCIFEROL) 1.25 MG (50000 UNIT) PO CAPS: 50000.0000 [IU] | ORAL_CAPSULE | ORAL | 3 refills | Status: DC

## 2023-01-20 NOTE — Progress Notes (Signed)
Results sent through MyChart

## 2023-01-22 LAB — RPR+HIV+GC+CT PANEL
Chlamydia trachomatis, NAA: NEGATIVE
HIV Screen 4th Generation wRfx: NONREACTIVE
Neisseria Gonorrhoeae by PCR: NEGATIVE
RPR Ser Ql: NONREACTIVE

## 2023-01-22 LAB — VITAMIN D 25 HYDROXY (VIT D DEFICIENCY, FRACTURES): Vit D, 25-Hydroxy: 38.5 ng/mL (ref 30.0–100.0)

## 2023-01-22 LAB — COMPREHENSIVE METABOLIC PANEL
ALT: 14 IU/L (ref 0–32)
AST: 16 IU/L (ref 0–40)
Albumin/Globulin Ratio: 1.4 (ref 1.2–2.2)
Albumin: 4.1 g/dL (ref 3.9–4.9)
Alkaline Phosphatase: 112 IU/L (ref 44–121)
BUN/Creatinine Ratio: 16 (ref 9–23)
BUN: 11 mg/dL (ref 6–24)
Bilirubin Total: 0.5 mg/dL (ref 0.0–1.2)
CO2: 24 mmol/L (ref 20–29)
Calcium: 9 mg/dL (ref 8.7–10.2)
Chloride: 103 mmol/L (ref 96–106)
Creatinine, Ser: 0.69 mg/dL (ref 0.57–1.00)
Globulin, Total: 3 g/dL (ref 1.5–4.5)
Glucose: 78 mg/dL (ref 70–99)
Potassium: 3.9 mmol/L (ref 3.5–5.2)
Sodium: 141 mmol/L (ref 134–144)
Total Protein: 7.1 g/dL (ref 6.0–8.5)
eGFR: 108 mL/min/{1.73_m2} (ref >59)

## 2023-01-22 LAB — HEPATITIS B SURFACE ANTIGEN: Hepatitis B Surface Ag: NEGATIVE

## 2023-01-22 LAB — CBC
Hematocrit: 37 % (ref 34.0–46.6)
Hemoglobin: 12.4 g/dL (ref 11.1–15.9)
MCH: 29.7 pg (ref 26.6–33.0)
MCHC: 33.5 g/dL (ref 31.5–35.7)
MCV: 89 fL (ref 79–97)
Platelets: 252 10*3/uL (ref 150–450)
RBC: 4.18 x10E6/uL (ref 3.77–5.28)
RDW: 13.3 % (ref 11.7–15.4)
WBC: 7.2 10*3/uL (ref 3.4–10.8)

## 2023-01-22 LAB — LIPID PANEL
Chol/HDL Ratio: 3.4 ratio (ref 0.0–4.4)
Cholesterol, Total: 163 mg/dL (ref 100–199)
HDL: 48 mg/dL (ref >39)
LDL Chol Calc (NIH): 104 mg/dL — ABNORMAL HIGH (ref 0–99)
Triglycerides: 52 mg/dL (ref 0–149)
VLDL Cholesterol Cal: 11 mg/dL (ref 5–40)

## 2023-01-22 LAB — HEPATITIS C ANTIBODY: Hep C Virus Ab: NONREACTIVE

## 2023-01-23 NOTE — Progress Notes (Signed)
Results sent through MyChart

## 2023-02-06 ENCOUNTER — Other Ambulatory Visit (INDEPENDENT_AMBULATORY_CARE_PROVIDER_SITE_OTHER): Payer: Managed Care, Other (non HMO)

## 2023-02-06 ENCOUNTER — Telehealth: Payer: Managed Care, Other (non HMO) | Admitting: Medical

## 2023-02-06 ENCOUNTER — Encounter: Payer: Self-pay | Admitting: Medical

## 2023-02-06 VITALS — Wt 219.0 lb

## 2023-02-06 DIAGNOSIS — J029 Acute pharyngitis, unspecified: Secondary | ICD-10-CM | POA: Diagnosis not present

## 2023-02-06 DIAGNOSIS — R52 Pain, unspecified: Secondary | ICD-10-CM

## 2023-02-06 LAB — POCT RAPID STREP A (OFFICE): Rapid Strep A Screen: NEGATIVE

## 2023-02-06 LAB — POCT INFLUENZA A/B
Influenza A, POC: NEGATIVE
Influenza B, POC: NEGATIVE

## 2023-02-06 LAB — POC COVID19 BINAXNOW: SARS Coronavirus 2 Ag: NEGATIVE

## 2023-02-06 NOTE — Progress Notes (Signed)
Subjective:     Patient ID: Dawn Goodwin, female   DOB: 06-Feb-1976, 47 y.o.   MRN: [id number]  This visit type was conducted due to national recommendations for restrictions regarding the COVID-19 Pandemic (e.g. social distancing) in an effort to limit this patient's exposure and mitigate transmission in our community.  Due to their co-morbid illnesses, this patient is at least at moderate risk for complications without adequate follow up.  This format is felt to be most appropriate for this patient at this time.    Documentation for virtual audio and video telecommunications through [place] encounter:  The patient was located at home. The provider was located in the office. The patient did consent to this visit and is aware of possible charges through their insurance for this visit.  The other persons participating in this telemedicine service were none. Time spent on call was 20 minutes and in review of previous records 20 minutes total.  This virtual service is not related to other E/M service within previous 7 days.   HPI Chief Complaint  Patient presents with   URI    Complains of left side sore throat, hoarseness, sinus congestion, lightheadedness, body aches and cough x 2 day. Hurts to swallow. States she took 1 dose of Tamiflu which she was prescribed earlier this month due to influenza exposure.    Virtual for illness.  She notes about 2 day hx/o left sided sore throat, hoarseness, sinus pressure, lightheaded, body aches, and cough x 2 days.   Hurts to swallow.  Has some nausea, but no vomiting.  No fever.   No chills.  Voice left 2 days ago.  Sore throat 7/10 pain.   Has some sick contacts at work.  Has occasional cough that is out of the blue, no consistency.  No recent covid test.  Son had influenza 3 or so weeks ago.  No other aggravating or relieving factors. No other complaint.  Past Medical History:  Diagnosis Date   Acne    Back pain 2002   went through PT     GERD (gastroesophageal reflux disease)    intermittent   Headache    History of UTI    once prior as of 03/2015   Knee pain 2007   right   Pregnancy induced hypertension    Seizure disorder ([place])    from infancy til age 4yo, on medication during that time, etiology unclear?   Wears glasses    Current Outpatient Medications on File Prior to Visit  Medication Sig Dispense Refill   Vitamin D, Ergocalciferol, (DRISDOL) 1.25 MG (50000 UNIT) CAPS capsule Take 1 capsule (50,000 Units total) by mouth every 7 (seven) days. 12 capsule 3   No current facility-administered medications on file prior to visit.    Review of Systems As in subjective    Objective:   Physical Exam Due to coronavirus pandemic stay at home measures, patient visit was virtual and they were not examined in person.   Wt 219 lb (99.3 kg)   LMP 01/30/2023   BMI 33.79 kg/m   Gen: wd, wn, nad Somewhat ill appearing No labored breathing or wheezing, voice is somewhat muffled      Assessment:     Encounter Diagnoses  Name Primary?   Sore throat Yes   Body aches        Plan:     She was advised to come to our back parking lot for testing.  She was negative for COVID flu and  strep testing  Advised salt water gargles, warm fluids, Tylenol or ibuprofen for body aches and sore throat pain, can use Chloraseptic sore throat spray, and take it easy the next few days.  She will also try some over-the-counter TheraFlu for symptoms.  Discussed typical timeframe to see improvements.  If worse or new symptoms in the next few days then can recheck or call back   Dawn Goodwin was seen today for uri.  Diagnoses and all orders for this visit:  Sore throat  Body aches  F/u prn

## 2023-02-08 ENCOUNTER — Other Ambulatory Visit: Payer: Managed Care, Other (non HMO)

## 2023-02-09 ENCOUNTER — Telehealth: Payer: Self-pay | Admitting: Medical

## 2023-02-09 ENCOUNTER — Other Ambulatory Visit: Payer: Self-pay | Admitting: Medical

## 2023-02-09 MED ORDER — HYDROCOD POLI-CHLORPHE POLI ER 10-8 MG/5ML PO SUER: 5.0000 mL | Freq: Two times a day (BID) | ORAL | 0 refills | Status: DC

## 2023-02-09 MED ORDER — AZITHROMYCIN 250 MG PO TABS: ORAL_TABLET | ORAL | 0 refills | Status: DC

## 2023-02-09 NOTE — Telephone Encounter (Signed)
Pt called and states that she is coughing real bad, and is really congested  And is blowing out really thick green mucous  said that started around Tuesday and has had some throwing up Pt wants to know if you will call her in something for the coughing Pt uses  [place], [place] [address]

## 2023-02-10 NOTE — Telephone Encounter (Signed)
Pt was notified but states she still feels bad- her voice is better and throat is better but she is vomiting and still achy. No fever phlegm is green. She has missed work Monday, Thursday and possibility tonight due to sickness so she will need FMLA for herself.   The FMLA that you have received is for her son. Just to secure her job if she has to be out with him for any reason

## 2023-02-10 NOTE — Telephone Encounter (Signed)
I received an FMLA form.  Just verify with her what condition or concerns is this FMLA regarding

## 2023-02-13 ENCOUNTER — Telehealth: Payer: Self-pay | Admitting: Medical

## 2023-02-13 NOTE — Telephone Encounter (Signed)
Dawn Goodwin said you placed this on her desk and she faxed it this am.

## 2023-02-13 NOTE — Telephone Encounter (Signed)
I received a email back about her FMLA paperwork.  I completed FMLA paperwork last week but I included both her son's issues and her issues on the same 1.  So first follow-up will need to complete 2 separate ones 1 regarding jobs coverage for son and then a separate one for [place]?  I saw her concerns, that she is asking me to write on here 3 times every 7 days.  I will be me asking her employer to allow her to potentially be out of work 3 days out of 5 every single week which is absurd.  I cannot write that.  What I have on file is that he has seen endocrinologist and myself periodically over the last 6 months.  If he is getting some of the therapy currently on a weekly basis and let me know.  I would also need that documentation from therapy.  Otherwise, there is no way I can write for someone to be potentially out of work due to health issues 3 days every single week

## 2023-02-23 ENCOUNTER — Encounter: Payer: Self-pay | Admitting: Radiology

## 2023-02-28 NOTE — Telephone Encounter (Signed)
Please find the phone number to call her FMLA paperwork department  I do not know what else to put on the form  She needs FMLA for 2 purposes.  The main purpose is her son has autism and she needs FMLA paperwork to be able to take him to appointments or medical treatments or sick visits when needed.  This will be an ongoing thing.  FMLA is usually renewed every 6 months, but her need for FMLA to take care of her send healthcare issues will not change  I also have completed paperwork where she had a recent short-term illness where she was out of work for a few days consecutively  I tried to make this clear on the FMLA paperwork  I do not know what else I need to add to making any clearer

## 2023-03-01 ENCOUNTER — Telehealth: Payer: Self-pay

## 2023-03-01 NOTE — Telephone Encounter (Signed)
Separated the FMLA to 2 separate forms and faxed

## 2023-03-01 NOTE — Telephone Encounter (Signed)
Called pt and left vm letting her know her FMLA form is available for pickup. Placed in brown folder up front.

## 2023-03-08 ENCOUNTER — Telehealth: Payer: Self-pay | Admitting: Internal Medicine

## 2023-03-08 NOTE — Telephone Encounter (Signed)
Waiting for shane to give me forms back

## 2023-03-08 NOTE — Telephone Encounter (Signed)
Pt sent an email and states that her paperwork was filled out wrong again. They have extended the grace period for her to return papers in 7 days. Please fill out

## 2023-03-09 NOTE — Telephone Encounter (Signed)
Pt requesting a call back so she can explain what she needs with the forms. She said the form was only supposed to have her on it and not [place].

## 2023-03-09 NOTE — Telephone Encounter (Signed)
Pt needs FMLA for her sickness when she was out from 2/19-2/25. Her Work needs 24 and 25 put on there for FMLA. Per shane ok to put days 24th and 25th down on FMLA.  Pt works 1am-9:30am during the week and works every other weekend  Pt does not need FMLA for [employer] as his was approved.   Beverlee Nims will update papers and fax them in

## 2023-03-13 NOTE — Telephone Encounter (Signed)
Received paperwork again today about missing work on it: Filled out the following information  Leaved supported: continuous leave  Continuous leave  02/06/23-02/12/23 due to acute sickness  No treatment was needed or no additional treatment is needed  Pt was out for acute illness no none of the following apply: inpatient care, pregnancy, incapacity plus treatment, chronic condition, permanent long term condition  Symptoms- vomiting, body aches, sore throat, cough and sinus pressure  Date of condition: 02/06/23 1st time of treatment- 02/06/23 Most recent date of treatment- 02/06/23 Next appt scheduled: None Probable duration of condition: 02/06/23-02/12/23  Medication prescribed for condition: NO  Referred to another provider- NO    Faxed all this paperwork back and also sent it to patient

## 2023-07-03 ENCOUNTER — Other Ambulatory Visit: Payer: Self-pay | Admitting: Medical

## 2023-07-03 DIAGNOSIS — Z1231 Encounter for screening mammogram for malignant neoplasm of breast: Secondary | ICD-10-CM

## 2023-07-05 ENCOUNTER — Ambulatory Visit
Admission: RE | Admit: 2023-07-05 | Discharge: 2023-07-05 | Discharge status: Home / Self Care | Payer: Managed Care, Other (non HMO) | Source: Ambulatory Visit | Attending: Medical | Admitting: Medical

## 2023-07-05 DIAGNOSIS — Z1231 Encounter for screening mammogram for malignant neoplasm of breast: Secondary | ICD-10-CM

## 2023-07-06 NOTE — Progress Notes (Signed)
Results sent through MyChart

## 2023-08-03 ENCOUNTER — Ambulatory Visit: Payer: Managed Care, Other (non HMO) | Admitting: Family Medicine

## 2023-08-03 ENCOUNTER — Encounter: Payer: Self-pay | Admitting: Family Medicine

## 2023-08-03 VITALS — BP 132/88 | HR 78 | Wt 224.6 lb

## 2023-08-03 DIAGNOSIS — R42 Dizziness and giddiness: Secondary | ICD-10-CM

## 2023-08-03 DIAGNOSIS — N926 Irregular menstruation, unspecified: Secondary | ICD-10-CM | POA: Diagnosis not present

## 2023-08-03 DIAGNOSIS — H8111 Benign paroxysmal vertigo, right ear: Secondary | ICD-10-CM

## 2023-08-03 LAB — POCT URINE PREGNANCY: Preg Test, Ur: NEGATIVE

## 2023-08-03 NOTE — Progress Notes (Signed)
Chief Complaint  Patient presents with   other    Started feeling lightheaded at work 7:30 am this morning and ate some chips after getting light headed and drank some water, checked BP at work around 9:30 it 166/100, when she turned around at work real fast that's when she got dizzy and lightheaded    Works at [employer].  [employer] off at 9:30 am. While at work today, she starting feeling badly.  She went on lunch break  from 6:50-7:20am.   All she had was a Bloom supplement (greens/superfoods, powder), which she added to G-Fit (10 calories, gatorade). Last meal prior to that was yesterday 2pm.  At 7:35 she felt lightheaded and dizzy. When she she turned quickly, she had acute onset of feeling bad, felt like her equilibrium was off (didn't feel like she would faint.). When she closed her eyes and tried to lay down, she noticed spinning sensation. She had some nausea then.  She ate some chips, still felt bad. Felt bad until she went to the nurse station at work at 9:30. BP was 166/100 at that time.  She has no h/o high BP.  Still feels like her balance is off a little, needs to walk slowly, afraid she will feel dizzy/spinning if she moves quickly.  Sitting at visit, feels fine. When stood in waiting room, felt off balance again.   PMH, PSH, SH reviewed  Outpatient Encounter Medications as of 08/03/2023  Medication Sig Note   acetaminophen (TYLENOL) 325 MG tablet Take 650 mg by mouth every 6 (six) hours as needed. 08/03/2023: Took today    Vitamin D, Ergocalciferol, (DRISDOL) 1.25 MG (50000 UNIT) CAPS capsule Take 1 capsule (50,000 Units total) by mouth every 7 (seven) days.    azithromycin (ZITHROMAX) 250 MG tablet 2 tablets day 1, then 1 tablet days 2-4 (Patient not taking: Reported on 08/03/2023)    chlorpheniramine-HYDROcodone (TUSSIONEX) 10-8 MG/5ML Take 5 mLs by mouth 2 (two) times daily. (Patient not taking: Reported on 08/03/2023)    No facility-administered encounter medications on  file as of 08/03/2023.   Allergies  Allergen Reactions   Cyclinex [Tetracycline Hcl] Swelling    Face, hands, feet    ROS: No f/c No URI symptoms No chest pain, shortness of breath, palpitations No diarrhea Nausea only when dizzy, no vomiting. No urinary complaints. No contraception, did have unprotected sex.  Perimenopausal, last period 2 months ago.    PHYSICAL EXAM:  BP 132/88   Pulse 78   Wt 224 lb 9.6 oz (101.9 kg)   LMP  (LMP Unknown) Comment: been a few months  BMI 34.66 kg/m   BP Readings from Last 3 Encounters:  08/03/23 132/88  01/19/23 120/80  09/28/22 122/80   Well-appearing female, in no distress. Slightly anxious, moving slowly for fear of feeling badly. HEENT: conjunctiva and sclera are clear, EOMI. OP clear. Fundi benign. Sinuses nontender. No nasal drainage. Neck: no lymphadenopathy, thyromegaly or carotid bruit Heart: regular rate and rhythm Lungs: clear bilaterally Abdomen: soft, nontender Extremities: no edema Psych: mildly anxious, full range of affect. Normal eye contact, speech, hygiene and grooming Neuro: alert and oriented. Cranial nerves intact. Normal strength, DTR's 2+ and symmetric. Normal finger to nose.  Normal gait (didn't test tandem gait).  Had vertigo with laying down with head to the right. Didn't feel good with head to the left only when eyes were closed (felt fine with eyes open). No issues with laying with head straight forward.   Urine pregnancy test  negative   ASSESSMENT/PLAN:  Benign paroxysmal positional vertigo of right ear - Discussed Ddx of vertigo.  Unclear if dehydration could be contributing.  Meclizine prn. To contact us if persists/worsens for PT referral  Dizziness - vertigo component but more than that also.  She hadn't eaten or had much to drink at the time she felt bad.  Avoid skipping meals, stay hydrated  Irregular menses - Plan: POCT urine pregnancy  I spent 32 minutes dedicated to the care of this  patient, including pre-visit review of records, face to face time, post-visit ordering of testing and documentation.  Drink plenty of water to stay well hydrated. You have had VERY little caloric intake, and that might contribute to you feeling lousy. Please avoid skipping meals.   Eat something when you get home. You may want to pick up meclizine (Bonine, or certain dramamine formulations) which can help with vertigo. This potentially can make you sleepy.  It is probably a good idea to take before going to bed, so that it might help prevent the vertigo if you end up laying on your right side.  Dosing for meclizine is 12.5-25 every 8 hours as needed for vertigo. If your dizziness continues to be positional (ie worse in certain positions, like it was in the office, when you were laying down with your head turned to the right), then physical therapy might be a treatment option, if this isn't resolving on its own.

## 2023-08-03 NOTE — Patient Instructions (Signed)
  Drink plenty of water to stay well hydrated. You have had VERY little caloric intake, and that might contribute to you feeling lousy. Please avoid skipping meals.   Eat something when you get home. You may want to pick up meclizine (Bonine, or certain dramamine formulations) which can help with vertigo. This potentially can make you sleepy.  It is probably a good idea to take before going to bed, so that it might help prevent the vertigo if you end up laying on your right side.  Dosing for meclizine is 12.5-25 every 8 hours as needed for vertigo. If your dizziness continues to be positional (ie worse in certain positions, like it was in the office, when you were laying down with your head turned to the right), then physical therapy might be a treatment option, if this isn't resolving on its own.

## 2023-08-04 ENCOUNTER — Telehealth: Payer: Self-pay | Admitting: Medical

## 2023-08-04 NOTE — Telephone Encounter (Signed)
Pt was notified but FMLA is needed for her since she didn't go to work last night. Work note will not work

## 2023-08-04 NOTE — Telephone Encounter (Signed)
Pt called stating she still has dizziness  wants rx States she took dramamine (only took once)   She also wants FMLA for work, she states she works 3rd shift and was not able to drive last night Pt states employer requires FMLA for absence, they will not take notes

## 2023-08-04 NOTE — Telephone Encounter (Signed)
Refilled her son's FMLA out and waiting on hers to come through. Dawn Goodwin was checking on this

## 2023-08-07 ENCOUNTER — Telehealth: Payer: Self-pay

## 2023-08-07 NOTE — Telephone Encounter (Signed)
FMLA form received. Sent back in folder.

## 2023-08-08 NOTE — Telephone Encounter (Signed)
Per pt request, FMLA forms faxed to [employer] [phone]) and mailed to the pt address in chart.

## 2023-10-04 ENCOUNTER — Ambulatory Visit (INDEPENDENT_AMBULATORY_CARE_PROVIDER_SITE_OTHER): Payer: Managed Care, Other (non HMO) | Admitting: Medical

## 2023-10-04 VITALS — BP 110/70 | HR 96 | Wt 225.6 lb

## 2023-10-04 DIAGNOSIS — R635 Abnormal weight gain: Secondary | ICD-10-CM | POA: Diagnosis not present

## 2023-10-04 DIAGNOSIS — E66811 Obesity, class 1: Secondary | ICD-10-CM

## 2023-10-04 DIAGNOSIS — R42 Dizziness and giddiness: Secondary | ICD-10-CM

## 2023-10-04 MED ORDER — WEGOVY 0.25 MG/0.5ML ~~LOC~~ SOAJ: 0.2500 mg | SUBCUTANEOUS | 0 refills | Status: DC

## 2023-10-04 MED ORDER — WEGOVY 0.5 MG/0.5ML ~~LOC~~ SOAJ
0.5000 mg | SUBCUTANEOUS | 0 refills | Status: DC
Start: 2023-10-04 — End: 2023-11-20

## 2023-10-04 MED ORDER — PHENTERMINE HCL 37.5 MG PO TABS: 37.5000 mg | ORAL_TABLET | Freq: Every day | ORAL | 0 refills | Status: DC

## 2023-10-04 NOTE — Progress Notes (Signed)
Subjective:  Dawn Goodwin is a 47 y.o. female who presents for Chief Complaint  Patient presents with   Medical Management of Chronic Issues    Discuss weight and still having vertigo since August     Here for 2 issues.  She would like to get back on some medication to help with weight loss.  She has had success in the past using a trainer.  She has also had success in the past using phentermine.  She would like to get back on phentermine.  She has been working at [employer] at home.  She just recently bought an air [place] to use instead of fried foods on the state of.  She has her gym membership back and is going back to the gym now.  Her son needs to lose weight as well so they are both going.  Exercise - going to gym.  Starting back at 3 days per week in gym along with her son.  She also notes some flareup of vertigo.  She saw Dr. Susann Goodwin here in the past for vertigo, was advised meclizine which she has used some with some benefit.  Her symptoms flared up in the last week mildly.  She just got another box of meclizine to try again  No other aggravating or relieving factors.    No other c/o.  Past Medical History:  Diagnosis Date   Acne    Back pain 2002   went through PT    GERD (gastroesophageal reflux disease)    intermittent   Headache    History of UTI    once prior as of 03/2015   Knee pain 2007   right   Pregnancy induced hypertension    Seizure disorder (HCC)    from infancy til age 55yo, on medication during that time, etiology unclear?   Wears glasses    Current Outpatient Medications on File Prior to Visit  Medication Sig Dispense Refill   dimenhyDRINATE (MOTION SICKNESS PO) Take by mouth.     No current facility-administered medications on file prior to visit.     The following portions of the patient's history were reviewed and updated as appropriate: allergies, current medications, past family history, past medical history, past social  history, past surgical history and problem list.  ROS Otherwise as in subjective above    Objective: BP 110/70   Pulse 96   Wt 225 lb 9.6 oz (102.3 kg)   BMI 34.81 kg/m   BP Readings from Last 3 Encounters:  10/04/23 110/70  08/03/23 132/88  01/19/23 120/80    Wt Readings from Last 3 Encounters:  10/04/23 225 lb 9.6 oz (102.3 kg)  08/03/23 224 lb 9.6 oz (101.9 kg)  02/06/23 219 lb (99.3 kg)    General appearance: alert, no distress, well developed, well nourished HEENT: normocephalic, sclerae anicteric, conjunctiva pink and moist, TMs pearly, nares patent, no discharge or erythema, pharynx normal Oral cavity: MMM, no lesions Neck: supple, no lymphadenopathy, no thyromegaly, no masses Heart: RRR, normal S1, S2, no murmurs Lungs: CTA bilaterally, no wheezes, rhonchi, or rales Pulses: 2+ radial pulses, 2+ pedal pulses, normal cap refill Ext: no edema Neuro: CN II through XII intact, nonfocal exam   Assessment: Encounter Diagnoses  Name Primary?   Vertigo Yes   Obesity, Class I, BMI 30.0-34.9 (see actual BMI)    Weight gain        Plan: Veritgo -begin back on meclizine twice a day for the next 3 to  5 days.  Hydrate well.  Avoid sudden motions.  If not improving within the next week let me know I can refer for vestibular rehab  Obesity -counseled on diet, exercise, asked her to increase exercise 3 days/week minimum.  If insurance covers [phone] we will begin trial of Wegovy.  If not we will start with phentermine which she has taken before.  We discussed risk and benefits and proper use of both medications.  Dawn Goodwin was seen today for medical management of chronic issues.  Diagnoses and all orders for this visit:  Vertigo  Obesity, Class I, BMI 30.0-34.9 (see actual BMI)  Weight gain  Other orders -     Semaglutide-Weight Management (WEGOVY) 0.25 MG/0.5ML SOAJ; Inject 0.25 mg into the skin once a week. -     Semaglutide-Weight Management (WEGOVY) 0.5  MG/0.5ML SOAJ; Inject 0.5 mg into the skin once a week. -     phentermine (ADIPEX-P) 37.5 MG tablet; Take 1 tablet (37.5 mg total) by mouth daily before breakfast.   Follow up: 5-6 weeks

## 2023-10-09 ENCOUNTER — Telehealth: Payer: Self-pay

## 2023-10-09 NOTE — Telephone Encounter (Signed)
Key: [id number] Rx #: [id number] Drug: Wegovy 0.25MG /0.5ML auto-injectors Form: [employer] Electronic Prior Authorization Form (2017 NCP[employer]) Determination: Wait for Determination

## 2023-10-23 NOTE — Telephone Encounter (Signed)
Received a fax that appeal was approved. Pt needs to enroll in [employer] for product coverage. Pt notified and states she is enrolled through [employer].  Approval faxed to pharmacy.

## 2023-10-31 ENCOUNTER — Telehealth: Payer: Self-pay | Admitting: Internal Medicine

## 2023-10-31 NOTE — Telephone Encounter (Signed)
Pt called and states that she needs her FMLA paper work filled out again and needs it to state that she can do 2 office visits per month for 2 hours.  You wrote 1 visit per month for 4 hours. However her office visits don't last 4 hours so she had another visit that she though she could split up the hours and her boss denied her request.    I have placed forms in your red folder

## 2023-10-31 NOTE — Telephone Encounter (Signed)
Per Vincenza Hews ok to do 2 times every 30 days for 2 hours. Changed the date to 10/06/23-04/05/24 since that is the last time we saw Gregary Signs here.

## 2023-11-07 ENCOUNTER — Encounter: Payer: Self-pay | Admitting: Medical

## 2023-11-07 ENCOUNTER — Telehealth: Payer: Managed Care, Other (non HMO) | Admitting: Medical

## 2023-11-07 DIAGNOSIS — R110 Nausea: Secondary | ICD-10-CM

## 2023-11-07 DIAGNOSIS — J988 Other specified respiratory disorders: Secondary | ICD-10-CM

## 2023-11-07 DIAGNOSIS — R051 Acute cough: Secondary | ICD-10-CM | POA: Diagnosis not present

## 2023-11-07 DIAGNOSIS — Z636 Dependent relative needing care at home: Secondary | ICD-10-CM | POA: Diagnosis not present

## 2023-11-07 NOTE — Progress Notes (Signed)
Subjective:     Patient ID: Dawn Goodwin, female   DOB: 1976-01-29, 47 y.o.   MRN: [id number]  This visit type was conducted due to national recommendations for restrictions regarding the COVID-19 Pandemic (e.g. social distancing) in an effort to limit this patient's exposure and mitigate transmission in our community.  Due to their co-morbid illnesses, this patient is at least at moderate risk for complications without adequate follow up.  This format is felt to be most appropriate for this patient at this time.    Documentation for virtual audio and video telecommunications through [place] encounter:  The patient was located at home. The provider was located in the office. The patient did consent to this visit and is aware of possible charges through their insurance for this visit.  The other persons participating in this telemedicine service were none. Time spent on call was 20 minutes and in review of previous records 20 minutes total.  This virtual service is not related to other E/M service within previous 7 days.   HPI Chief Complaint  Patient presents with   not feeling well    Thrown up Friday, congestion clear mucous    Virtual for illness.  She has some episodes of nausea and vomiting 4 days ago but just nauseous now.  She felt little dizzy as well.  She does have a little bit of cough and congestion, some headache.  Overall symptoms are relatively mild.  Her son on the other hand has been sick for the last 5 days with cough, congestion, colored phlegm, nausea, vomiting and restlessness.  They have both missed several days of work and school recently respectively.  No other aggravating or relieving factors. No other complaint.  Past Medical History:  Diagnosis Date   Acne    Back pain 2002   went through PT    GERD (gastroesophageal reflux disease)    intermittent   Headache    History of UTI    once prior as of 03/2015   Knee pain 2007   right   Pregnancy induced  hypertension    Seizure disorder (HCC)    from infancy til age 76yo, on medication during that time, etiology unclear?   Wears glasses    Current Outpatient Medications on File Prior to Visit  Medication Sig Dispense Refill   dimenhyDRINATE (MOTION SICKNESS PO) Take by mouth.     Semaglutide-Weight Management (WEGOVY) 0.25 MG/0.5ML SOAJ Inject 0.25 mg into the skin once a week. 2 mL 0   Semaglutide-Weight Management (WEGOVY) 0.5 MG/0.5ML SOAJ Inject 0.5 mg into the skin once a week. 2 mL 0   No current facility-administered medications on file prior to visit.     Review of Systems As in subjective    Objective:   Physical Exam Due to coronavirus pandemic stay at home measures, patient visit was virtual and they were not examined in person.   There were no vitals taken for this visit.  General: Well-developed well-nourished no acute distress No labored breathing or wheezing      Assessment:     Encounter Diagnoses  Name Primary?   Nausea Yes   Acute cough    Respiratory tract infection    Caregiver stress         Plan:     Discussed her symptoms and concerns.  Advise good water intake, rest, can use or continue the Alka-Seltzer congestion medicine she is already using.  She can use some Zofran to help with nausea.  I called in Zofran for her son today.  She can use the same medication as they will likely not need the entire supply  If symptoms worsen next few days call or recheck.  Her symptoms suggest viral syndrome at this point  Unfortunately she was having a hard time with her autistic son today who also did a virtual with.  He was seeming distress today so we discussed his more serious symptoms and that she may need to get him further evaluated if he does not seem to be improving the next 24 hours.  Note given for work and school for both mom and son due to recent absence with this illness.   Dawn Goodwin was seen today for not feeling well.  Diagnoses and all  orders for this visit:  Nausea  Acute cough  Respiratory tract infection  Caregiver stress    Follow up as needed

## 2023-11-17 ENCOUNTER — Other Ambulatory Visit: Payer: Self-pay | Admitting: Medical

## 2023-11-20 ENCOUNTER — Ambulatory Visit (INDEPENDENT_AMBULATORY_CARE_PROVIDER_SITE_OTHER): Payer: Managed Care, Other (non HMO) | Admitting: Medical

## 2023-11-20 VITALS — BP 110/70 | HR 92 | Wt 214.8 lb

## 2023-11-20 DIAGNOSIS — R21 Rash and other nonspecific skin eruption: Secondary | ICD-10-CM

## 2023-11-20 DIAGNOSIS — W57XXXA Bitten or stung by nonvenomous insect and other nonvenomous arthropods, initial encounter: Secondary | ICD-10-CM

## 2023-11-20 DIAGNOSIS — Z6833 Body mass index (BMI) 33.0-33.9, adult: Secondary | ICD-10-CM

## 2023-11-20 MED ORDER — HYDROXYZINE HCL 10 MG PO TABS: 10.0000 mg | ORAL_TABLET | Freq: Three times a day (TID) | ORAL | 0 refills | Status: DC | PRN

## 2023-11-20 MED ORDER — WEGOVY 0.5 MG/0.5ML ~~LOC~~ SOAJ: 0.5000 mg | SUBCUTANEOUS | 0 refills | Status: DC

## 2023-11-20 MED ORDER — TRIAMCINOLONE ACETONIDE 0.1 % EX CREA: 1.0000 | TOPICAL_CREAM | Freq: Two times a day (BID) | CUTANEOUS | 0 refills | Status: DC

## 2023-11-20 NOTE — Progress Notes (Signed)
Subjective:  Dawn Goodwin is a 47 y.o. female who presents for Chief Complaint  Patient presents with   bites    Bites on arms- and body. Had a bug in a bag that looks like bed bugs. Not sure if that's it or not but a friend that came over had recents bites     Here for rash, bedbugs.  She had a rash just on her right hand maybe a week ago but now all of a sudden has several little bites on both buttocks, right elbow, behind the left knee.  When she woke up this morning she felt something on her leg and looked down and saw it was a bedbug and put it in a Ziploc bag.  She called an exterminator office and they are coming out today to inspect the house.    On an unrelated note, she just finished her first month of Wegovy and is ready to go to the next dose  No other aggravating or relieving factors.    No other c/o.  Past Medical History:  Diagnosis Date   Acne    Back pain 2002   went through PT    GERD (gastroesophageal reflux disease)    intermittent   Headache    History of UTI    once prior as of 03/2015   Knee pain 2007   right   Pregnancy induced hypertension    Seizure disorder (HCC)    from infancy til age 43yo, on medication during that time, etiology unclear?   Wears glasses    Current Outpatient Medications on File Prior to Visit  Medication Sig Dispense Refill   dimenhyDRINATE (MOTION SICKNESS PO) Take by mouth.     No current facility-administered medications on file prior to visit.     The following portions of the patient's history were reviewed and updated as appropriate: allergies, current medications, past family history, past medical history, past social history, past surgical history and problem list.  ROS Otherwise as in subjective above  Objective: BP 110/70   Pulse 92   Wt 214 lb 12.8 oz (97.4 kg)   BMI 33.15 kg/m   Wt Readings from Last 3 Encounters:  11/20/23 214 lb 12.8 oz (97.4 kg)  10/04/23 225 lb 9.6 oz (102.3 kg)  08/03/23 224 lb  9.6 oz (101.9 kg)    General appearance: alert, no distress, well developed, well nourished Skin: Several scattered slightly raised erythematous lesions somewhat of a wheeled appearance/urticaria appearance on the right dorsal hand, left and right buttocks, behind left knee    Assessment: Encounter Diagnoses  Name Primary?   Rash Yes   BMI 33.0-33.9,adult    Bedbug bite, initial encounter      Plan: Rash, bedbug bite You can use triamcinolone cream topically to the rash, you can use hydroxyzine oral tablet to help with itching We discussed general hygiene, treatment of linens.  She has a pest control company coming out today to do an evaluation and give treatment recommendations in the home If worsening in the next few days, consider prescription permethrin topical  Bmi > 30 Continue efforts with healthy lifestyle, exercise, diet, and go ahead and increase to the 0.5mg  wegovy weekly.   Dawn Goodwin was seen today for bites.  Diagnoses and all orders for this visit:  Rash  BMI 33.0-33.9,adult  Bedbug bite, initial encounter  Other orders -     triamcinolone cream (KENALOG) 0.1 %; Apply 1 Application topically 2 (two) times daily. -  hydrOXYzine (ATARAX) 10 MG tablet; Take 1 tablet (10 mg total) by mouth 3 (three) times daily as needed. -     Semaglutide-Weight Management (WEGOVY) 0.5 MG/0.5ML SOAJ; Inject 0.5 mg into the skin once a week.    Follow up: 6-8 weeks

## 2023-12-28 ENCOUNTER — Ambulatory Visit: Payer: Managed Care, Other (non HMO) | Admitting: Medical

## 2023-12-28 VITALS — BP 118/70 | HR 93 | Wt 209.6 lb

## 2023-12-28 DIAGNOSIS — E559 Vitamin D deficiency, unspecified: Secondary | ICD-10-CM

## 2023-12-28 DIAGNOSIS — M7581 Other shoulder lesions, right shoulder: Secondary | ICD-10-CM

## 2023-12-28 DIAGNOSIS — Z6834 Body mass index (BMI) 34.0-34.9, adult: Secondary | ICD-10-CM | POA: Diagnosis not present

## 2023-12-28 DIAGNOSIS — M65969 Unspecified synovitis and tenosynovitis, unspecified lower leg: Secondary | ICD-10-CM

## 2023-12-28 DIAGNOSIS — M79605 Pain in left leg: Secondary | ICD-10-CM

## 2023-12-28 MED ORDER — WEGOVY 1.7 MG/0.75ML ~~LOC~~ SOAJ: 1.7000 mg | SUBCUTANEOUS | 1 refills | Status: DC

## 2023-12-28 MED ORDER — WEGOVY 1 MG/0.5ML ~~LOC~~ SOAJ: 1.0000 mg | SUBCUTANEOUS | 0 refills | Status: DC

## 2023-12-28 MED ORDER — ONDANSETRON 4 MG PO TBDP: 4.0000 mg | ORAL_TABLET | Freq: Three times a day (TID) | ORAL | 0 refills | Status: DC | PRN

## 2023-12-28 NOTE — Patient Instructions (Signed)
 Leg pain Your left leg pain seems to be tendinitis of the tendons for Tibialis anterior and peroneus longus muscles. Likely aggravated by stairs, repetitive up-and-down motion such as stairs or walking For acute pain use cold therapy such as ice water pack, cloth in between your skin and the ice, 20 minutes 2-3 times a day when having flareup of pain You can use Aleve over-the-counter once or twice daily for the next few days, then daily as needed for pain short-term Consider a compressive knee sleeve/flexible compression for the next couple weeks You can use the standing crosslegged stretch to stretch out the IT band daily You can use calf raises up and down on the stool to stretch the leg muscles And in the short-term avoid using stairs or lots of walking for the next week and a half to let this improve little bit. If not improving over the next 2 weeks let me know and I can refer you to sports medicine  Arm pain You seem to have some rotator cuff tendinitis Similar to the leg pain, use ice therapy, rest, and Aleve to help with pain and inflammation You can use an arm sling short-term to rest the arm for an hour at a time Avoid sleeping directly on the arm.  Use pillows to cushion so you are not directly putting all your weight on the shoulder or arm Do some gentle stretches with the arm daily such as rotational stretches, shoulder shrugs, neck stretches After the next 5 to 7 days if you are improving then do some rotator cuff exercises such as the rotator cuff abduction and abduction exercise with a pulley cable weight at the gym Or you can use a dumbbell to do the same type of exercise if you are lying on your side If not much improved within the next 2 weeks, we can refer to sports medicine   BMI greater than 30 Increase to the 1 mg Wegovy .  Do this weekly for the next month After 1 month increase to the 1.7 mg Wegovy  Continue efforts to lose weight through healthy diet and  exercise Follow-up in 2 months

## 2023-12-28 NOTE — Progress Notes (Signed)
 Subjective:  Dawn Goodwin is a 48 y.o. female who presents for Chief Complaint  Patient presents with   arm and leg discomfort    Left leg, sharp pain when on her knees like she is praying.  And right arm when she sleeps on it     Here for compliant of pains in left lateral leg around knee, worse if kneeling. Sharp pain.  No fall, no injury.   No swelling.  No leg numbness or tingling.   Feels like something moving in that area at time.  If sitting cross leg has pain.  No particular activity recently to aggravate it.    Lately having pain in right upper arm, in sleep has to use left arm to pickup right arm at times.   Has pain.  Feels weaker in that arm.  No numbness, tingling or weakness.  Does sleep on right arm with it bent up.   Obesity-compliant with Wegovy .  Getting little nausea but otherwise doing okay on the medication.  Exercising several days per week.  Eating healthy diet, avoiding junk food and high calorie foods  No other aggravating or relieving factors.    No other c/o.  Past Medical History:  Diagnosis Date   Acne    Back pain 2002   went through PT    GERD (gastroesophageal reflux disease)    intermittent   Headache    History of UTI    once prior as of 03/2015   Knee pain 2007   right   Pregnancy induced hypertension    Seizure disorder (HCC)    from infancy til age 22yo, on medication during that time, etiology unclear?   Wears glasses    Current Outpatient Medications on File Prior to Visit  Medication Sig Dispense Refill   dimenhyDRINATE (MOTION SICKNESS PO) Take by mouth.     hydrOXYzine  (ATARAX ) 10 MG tablet Take 1 tablet (10 mg total) by mouth 3 (three) times daily as needed. 30 tablet 0   Semaglutide -Weight Management (WEGOVY ) 0.5 MG/0.5ML SOAJ Inject 0.5 mg into the skin once a week. 2 mL 0   triamcinolone  cream (KENALOG ) 0.1 % Apply 1 Application topically 2 (two) times daily. 30 g 0   No current facility-administered medications on file prior  to visit.     The following portions of the patient's history were reviewed and updated as appropriate: allergies, current medications, past family history, past medical history, past social history, past surgical history and problem list.  ROS Otherwise as in subjective above    Objective: BP 118/70   Pulse 93   Wt 209 lb 9.6 oz (95.1 kg)   BMI 32.34 kg/m  Wt Readings from Last 3 Encounters:  12/28/23 209 lb 9.6 oz (95.1 kg)  11/20/23 214 lb 12.8 oz (97.4 kg)  10/04/23 225 lb 9.6 oz (102.3 kg)    General appearance: alert, no distress, well developed, well nourished Left leg tender over the origin of the tibialis anterior and peroneus longus otherwise leg nontender with no swelling no deformity.  Worse pain bending and squatting down in the same area Right shoulder tender over the biceps origin slightly but mainly pain with crossover test, rotator cuff motions, but no swelling, no discoloration, rest of arm nontender and no deformity Range of motion seems relatively full Arms and legs neurovascularly intact     Assessment: Encounter Diagnoses  Name Primary?   Rotator cuff tendinitis, right Yes   Tibialis anterior tenosynovitis    Leg  pain, lateral, left    BMI 34.0-34.9,adult    Vitamin D  deficiency      Plan: Leg pain Your left leg pain seems to be tendinitis of the tendons for Tibialis anterior and peroneus longus muscles. Likely aggravated by stairs, repetitive up-and-down motion such as stairs or walking For acute pain use cold therapy such as ice water pack, cloth in between your skin and the ice, 20 minutes 2-3 times a day when having flareup of pain You can use Aleve over-the-counter once or twice daily for the next few days, then daily as needed for pain short-term Consider a compressive knee sleeve/flexible compression for the next couple weeks You can use the standing crosslegged stretch to stretch out the IT band daily You can use calf raises up and down  on the stool to stretch the leg muscles And in the short-term avoid using stairs or lots of walking for the next week and a half to let this improve little bit. If not improving over the next 2 weeks let me know and I can refer you to sports medicine  Arm pain You seem to have some rotator cuff tendinitis Similar to the leg pain, use ice therapy, rest, and Aleve to help with pain and inflammation You can use an arm sling short-term to rest the arm for an hour at a time Avoid sleeping directly on the arm.  Use pillows to cushion so you are not directly putting all your weight on the shoulder or arm Do some gentle stretches with the arm daily such as rotational stretches, shoulder shrugs, neck stretches After the next 5 to 7 days if you are improving then do some rotator cuff exercises such as the rotator cuff abduction and abduction exercise with a pulley cable weight at the gym Or you can use a dumbbell to do the same type of exercise if you are lying on your side If not much improved within the next 2 weeks, we can refer to sports medicine   BMI greater than 30 Increase to the 1 mg Wegovy .  Do this weekly for the next month After 1 month increase to the 1.7 mg Wegovy  Continue efforts to lose weight through healthy diet and exercise Follow-up in 2 months    Dawn Goodwin was seen today for arm and leg discomfort.  Diagnoses and all orders for this visit:  Rotator cuff tendinitis, right  Tibialis anterior tenosynovitis  Leg pain, lateral, left  BMI 34.0-34.9,adult  Vitamin D  deficiency  Other orders -     Semaglutide -Weight Management (WEGOVY ) 1 MG/0.5ML SOAJ; Inject 1 mg into the skin once a week. -     Semaglutide -Weight Management (WEGOVY ) 1.7 MG/0.75ML SOAJ; Inject 1.7 mg into the skin once a week.    Follow up: 2 months

## 2024-01-23 ENCOUNTER — Other Ambulatory Visit: Payer: Self-pay | Admitting: Medical

## 2024-01-30 ENCOUNTER — Other Ambulatory Visit (HOSPITAL_COMMUNITY)
Admission: RE | Admit: 2024-01-30 | Discharge: 2024-01-30 | Disposition: A | Discharge status: Home / Self Care | Payer: Managed Care, Other (non HMO) | Source: Ambulatory Visit | Attending: Medical | Admitting: Medical

## 2024-01-30 ENCOUNTER — Ambulatory Visit: Payer: Managed Care, Other (non HMO) | Admitting: Medical

## 2024-01-30 ENCOUNTER — Telehealth: Payer: Self-pay

## 2024-01-30 ENCOUNTER — Encounter: Payer: Self-pay | Admitting: Medical

## 2024-01-30 VITALS — BP 110/70 | HR 93 | Ht 67.0 in | Wt 208.0 lb

## 2024-01-30 DIAGNOSIS — N926 Irregular menstruation, unspecified: Secondary | ICD-10-CM

## 2024-01-30 DIAGNOSIS — Z124 Encounter for screening for malignant neoplasm of cervix: Secondary | ICD-10-CM | POA: Diagnosis present

## 2024-01-30 DIAGNOSIS — E559 Vitamin D deficiency, unspecified: Secondary | ICD-10-CM

## 2024-01-30 DIAGNOSIS — Z131 Encounter for screening for diabetes mellitus: Secondary | ICD-10-CM | POA: Diagnosis not present

## 2024-01-30 DIAGNOSIS — Z130 Encounter for screening for diseases of the blood and blood-forming organs and certain disorders involving the immune mechanism: Secondary | ICD-10-CM

## 2024-01-30 DIAGNOSIS — Z13220 Encounter for screening for lipoid disorders: Secondary | ICD-10-CM | POA: Diagnosis not present

## 2024-01-30 DIAGNOSIS — Z0000 Encounter for general adult medical examination without abnormal findings: Secondary | ICD-10-CM | POA: Diagnosis present

## 2024-01-30 DIAGNOSIS — Z1211 Encounter for screening for malignant neoplasm of colon: Secondary | ICD-10-CM

## 2024-01-30 DIAGNOSIS — Z113 Encounter for screening for infections with a predominantly sexual mode of transmission: Secondary | ICD-10-CM

## 2024-01-30 LAB — LIPID PANEL

## 2024-01-30 NOTE — Telephone Encounter (Signed)
FMLA forms sent back in folder. Pt notified that we have received them per her request.

## 2024-01-30 NOTE — Progress Notes (Signed)
Subjective: Chief Complaint  Patient presents with   Annual Exam    Fasting cpe, no concerns. Sees obgyn   Medical team: [facility]   Concerns: Overall doing well.  She has been using Wegovy to help with weight loss.  She just completed her first week on 1 mg.  She was prior on 0.5 mg for a month.  Doing okay on this medication.  Exercising several days a week, going to the gym regularly  She is curious about menopause.  Last menstrual period was in December.  The one before that was several months that her periods have been slowing down.  She thinks her mother was in menopause in her 30s.  She has concerns about melasma of the face.  Has seen dermatology prior for this.  1 cream that was given because darkening of the face.  She would like to use creams that she was prescribed by a prior dermatologist several years ago back around 2020.  She is not currently seeing dermatology  Gyn hx/o - 2 prior pregnancies, 1 live birth, 1 D&C.  No recent sexual activity, uses condoms.      Reviewed their medical, surgical, family, social, medication, and allergy history and updated chart as appropriate.  Past Medical History:  Diagnosis Date   Acne    Back pain 2002   went through PT    GERD (gastroesophageal reflux disease)    intermittent   Headache    History of UTI    once prior as of 03/2015   Knee pain 2007   right   Pregnancy induced hypertension    Seizure disorder (HCC)    from infancy til age 3yo, on medication during that time, etiology unclear?   Wears glasses     Past Surgical History:  Procedure Laterality Date   CESAREAN SECTION  12/19/2004   DILATION AND CURETTAGE OF UTERUS  12/20/2011   DILATION AND EVACUATION  12/26/2011   Procedure: DILATATION AND EVACUATION;  Surgeon: Caprice Beaver;  Location: [facility];  Service: Gynecology;  Laterality: N/A;    Social History   Socioeconomic History   Marital  status: Single    Spouse name: Not on file   Number of children: Not on file   Years of education: Not on file   Highest education level: Not on file  Occupational History   Not on file  Tobacco Use   Smoking status: Never   Smokeless tobacco: Never  Vaping Use   Vaping status: Never Used  Substance and Sexual Activity   Alcohol use: No   Drug use: No   Sexual activity: Not Currently  Other Topics Concern   Not on file  Social History Narrative   Exercise - prior trainer, using various exercise, HIT.   Goes gym regularly, boxing some on bag.  [employer], lives at home with her son Gregary Signs who has Autism;  Working at [employer], Engineer, mining.  Her sister helps watch Gregary Signs in evenings until she is off.   4 siblings, 2 sisters and 1 brother. 01/2024   Social Drivers of Health   Financial Resource Strain: Low Risk  (01/30/2024)   Overall Financial Resource Strain (CARDIA)    Difficulty of Paying Living Expenses: Not very hard  Food Insecurity: No Food Insecurity (01/30/2024)   Hunger Vital Sign    Worried About Running Out of Food in the Last Year: Never true    Ran Out of Food in  the Last Year: Never true  Transportation Needs: No Transportation Needs (01/30/2024)   PRAPARE - Administrator, Civil Service (Medical): No    Lack of Transportation (Non-Medical): No  Physical Activity: Sufficiently Active (01/30/2024)   Exercise Vital Sign    Days of Exercise per Week: 3 days    Minutes of Exercise per Session: 60 min  Stress: No Stress Concern Present (01/30/2024)   [employer] of Occupational Health - Occupational Stress Questionnaire    Feeling of Stress : Not at all  Social Connections: Moderately Isolated (01/30/2024)   Social Connection and Isolation Panel [NHANES]    Frequency of Communication with Friends and Family: More than three times a week    Frequency of Social Gatherings with Friends and Family: More than three times a week    Attends Religious  Services: More than 4 times per year    Active Member of [employer] or Organizations: No    Attends Banker Meetings: Never    Marital Status: Never married  Intimate Partner Violence: Not At Risk (01/30/2024)   Humiliation, Afraid, Rape, and Kick questionnaire    Fear of Current or Ex-Partner: No    Emotionally Abused: No    Physically Abused: No    Sexually Abused: No    Family History  Problem Relation Age of Onset   Hypertension Mother    Other Mother        total knee replacement   Arthritis Mother        knee replacement   Diabetes Father    Eczema Sister    Hypertension Maternal Grandmother    Diabetes Maternal Grandmother    Heart disease Maternal Grandmother        CHF   Alzheimer's disease Maternal Grandmother    Cancer Maternal Uncle        lung   Arthritis Cousin        knee replacement   Autism Son    Stroke Neg Hx      Current Outpatient Medications:    dimenhyDRINATE (MOTION SICKNESS PO), Take by mouth., Disp: , Rfl:    Semaglutide-Weight Management (WEGOVY) 1 MG/0.5ML SOAJ, Inject 1 mg into the skin once a week., Disp: 2 mL, Rfl: 0   ondansetron (ZOFRAN-ODT) 4 MG disintegrating tablet, Take 1 tablet (4 mg total) by mouth every 8 (eight) hours as needed for nausea or vomiting. (Patient not taking: Reported on 01/30/2024), Disp: 20 tablet, Rfl: 0   Semaglutide-Weight Management (WEGOVY) 1.7 MG/0.75ML SOAJ, Inject 1.7 mg into the skin once a week. (Patient not taking: Reported on 01/30/2024), Disp: 3 mL, Rfl: 1  Allergies  Allergen Reactions   Cyclinex [Tetracycline Hcl] Swelling    Face, hands, feet    Review of Systems  Constitutional:  Negative for chills, fever, malaise/fatigue and weight loss.  HENT:  Negative for congestion, ear pain, hearing loss, sore throat and tinnitus.   Eyes:  Negative for blurred vision, pain and redness.  Respiratory:  Negative for cough, hemoptysis and shortness of breath.   Cardiovascular:  Negative for chest pain,  palpitations, orthopnea, claudication and leg swelling.  Gastrointestinal:  Negative for abdominal pain, blood in stool, constipation, diarrhea, nausea and vomiting.  Genitourinary:  Negative for dysuria, flank pain, frequency, hematuria and urgency.  Musculoskeletal:  Positive for joint pain. Negative for back pain, falls and myalgias.  Skin:  Positive for rash. Negative for itching.  Neurological:  Negative for dizziness, tingling, speech change, weakness and headaches.  Endo/Heme/Allergies:  Negative for polydipsia. Does not bruise/bleed easily.  Psychiatric/Behavioral:  Negative for depression and memory loss. The patient is not nervous/anxious and does not have insomnia.        Objective:   Physical Exam  BP 110/70   Pulse 93   Ht 5\' 7"  (1.702 m)   Wt 208 lb (94.3 kg)   BMI 32.58 kg/m    Wt Readings from Last 3 Encounters:  01/30/24 208 lb (94.3 kg)  12/28/23 209 lb 9.6 oz (95.1 kg)  11/20/23 214 lb 12.8 oz (97.4 kg)   BP Readings from Last 3 Encounters:  01/30/24 110/70  12/28/23 118/70  11/20/23 110/70   General appearance: alert, no distress, WD/WN, AA female Skin: Somewhat darker shading coloration of both cheeks but no other worrisome lesions HEENT: normocephalic, conjunctiva/corneas normal, sclerae anicteric, PERRLA, EOMi Neck: supple, no lymphadenopathy, no thyromegaly, no masses, normal ROM, on bruits Chest: non tender, normal shape and expansion Heart: RRR, normal S1, S2, no murmurs Lungs: CTA bilaterally, no wheezes, rhonchi, or rales Abdomen: +bs, soft, non tender, non distended, no masses, no hepatomegaly, no splenomegaly, no bruits Back: non tender, normal ROM, no scoliosis Musculoskeletal: nontender, no swelling, no abnormality of knees or achilles, otherwise extremities non tender, no obvious deformity, normal ROM throughout, lower extremities non tender, no obvious deformity, normal ROM throughout Extremities: no edema, no cyanosis, no clubbing Pulses:  2+ symmetric, upper and lower extremities, normal cap refill Neurological: alert, oriented x 3, CN2-12 intact, strength normal upper extremities and lower extremities, sensation normal throughout, DTRs 2+ throughout, no cerebellar signs, gait normal Psychiatric: normal affect, behavior normal, pleasant   Breast: nontender, no masses or lumps, no skin changes, no nipple discharge or inversion, no axillary lymphadenopathy Gyn: Normal external genitalia without lesions, vagina with normal mucosa, cervix without lesions, no cervical motion tenderness, no abnormal vaginal discharge.  Uterus and adnexa not enlarged, nontender, no masses.  Pap performed.  Exam chaperoned by nurse. Rectal: Anus normal appearing     Assessment and Plan :    Encounter Diagnoses  Name Primary?   Encounter for health maintenance examination in adult Yes   Screening for diabetes mellitus    Screening for deficiency anemia    Screening for lipid disorders    Vitamin D deficiency    Screening for cervical cancer    Screen for STD (sexually transmitted disease)    Missed period    Screen for colon cancer    Today you had a preventative care visit or wellness visit.    Topics today may have included healthy lifestyle, diet, exercise, preventative care, vaccinations, sick and well care, proper use of emergency dept and after hours care, as well as other concerns.    Recommendations: Continue to return yearly for your annual wellness and preventative care visits.  This gives [place] a chance to discuss healthy lifestyle, exercise, vaccinations, review your chart record, and perform screenings where appropriate.  I recommend you see your eye doctor yearly for routine vision care.  I recommend you see your dentist yearly for routine dental care including hygiene visits twice yearly.  Vaccination recommendations were reviewed Immunization History  Administered Date(s) Administered   Hepatitis B, ADULT 08/05/2016,  10/05/2016, 02/06/2017   Influenza Split 09/22/2011, 08/07/2014   Influenza,inj,Quad PF,6+ Mos 12/23/2015, 08/25/2016, 10/03/2017, 10/05/2018, 10/08/2019, 11/26/2021, 01/19/2023   PPD Test 08/01/2016   Rho (D) Immune Globulin 12/25/2011   Tdap 04/15/2015     Screening for cancer: Breast cancer screening: You should  perform a self breast exam monthly.    Colon cancer screening:  I reviewed Cologuard negative from 11/2021.  Due repeat in 11/2024.  Cervical cancer screening: We reviewed recommendations for pap smear screening. Pap smear today  Skin cancer screening: Check your skin regularly for new changes, growing lesions, or other lesions of concern Come in for evaluation if you have skin lesions of concern.  Lung cancer screening: If you have a greater than 30 pack year history of tobacco use, then you qualify for lung cancer screening with a chest CT scan  We currently don't have screenings for other cancers besides breast, cervical, colon, and lung cancers.  If you have a strong family history of cancer or have other cancer screening concerns, please let me know.    Bone health: Get at least 150 minutes of aerobic exercise weekly Get weight bearing exercise at least once weekly   Heart health: Get at least 150 minutes of aerobic exercise weekly Limit alcohol It is important to maintain a healthy blood pressure and healthy cholesterol numbers   Separate significant issues discussed: She would like STD screening today  Continue efforts to lose weight through healthy diet and exercise.  After she completes 1 mg Wegovy for a month she will go up to 1.7 mg dose  Update hormone labs that she is likely heading towards menopause.  Last menstrual period December and 1 before that was a few months prior  Melasma of the face/discoloration per patient-I will see if we have any prior dermatology records to review and see what creams she has before that seem to work better    [facility] was seen today for annual exam.  Diagnoses and all orders for this visit:  Encounter for health maintenance examination in adult -     Comprehensive metabolic panel -     CBC -     TSH -     Lipid panel -     Hemoglobin A1c -     Hepatitis B surface antigen -     Hepatitis C antibody -     RPR+HIV+GC+CT Panel -     FSH/LH -     Estrogens, Total -     Cytology - PAP([place])  Screening for diabetes mellitus -     Hemoglobin A1c  Screening for deficiency anemia -     CBC  Screening for lipid disorders -     Lipid panel  Vitamin D deficiency  Screening for cervical cancer -     Cytology - PAP([place])  Screen for STD (sexually transmitted disease) -     Hepatitis B surface antigen -     Hepatitis C antibody -     RPR+HIV+GC+CT Panel  Missed period -     FSH/LH -     Estrogens, Total  Screen for colon cancer    F/u yearly for physical

## 2024-01-31 NOTE — Telephone Encounter (Signed)
FMLA- February 13- August 13  3 days (8 hours)  and 5 hours for an office visit

## 2024-01-31 NOTE — Progress Notes (Signed)
Results sent through MyChart

## 2024-02-01 LAB — CYTOLOGY - PAP
Adequacy: ABSENT
Comment: NEGATIVE
Diagnosis: NEGATIVE
High risk HPV: NEGATIVE

## 2024-02-01 NOTE — Progress Notes (Signed)
Results sent through MyChart

## 2024-02-02 LAB — CBC
Hematocrit: 42 % (ref 34.0–46.6)
Hemoglobin: 13.7 g/dL (ref 11.1–15.9)
MCH: 30.6 pg (ref 26.6–33.0)
MCHC: 32.6 g/dL (ref 31.5–35.7)
MCV: 94 fL (ref 79–97)
Platelets: 256 10*3/uL (ref 150–450)
RBC: 4.48 x10E6/uL (ref 3.77–5.28)
RDW: 12.8 % (ref 11.7–15.4)
WBC: 6.3 10*3/uL (ref 3.4–10.8)

## 2024-02-02 LAB — ESTROGENS, TOTAL: Estrogen: 93 pg/mL

## 2024-02-02 LAB — COMPREHENSIVE METABOLIC PANEL
ALT: 26 [IU]/L (ref 0–32)
AST: 21 [IU]/L (ref 0–40)
Albumin: 4.1 g/dL (ref 3.9–4.9)
Alkaline Phosphatase: 121 [IU]/L (ref 44–121)
BUN/Creatinine Ratio: 13 (ref 9–23)
BUN: 11 mg/dL (ref 6–24)
Bilirubin Total: 0.5 mg/dL (ref 0.0–1.2)
CO2: 24 mmol/L (ref 20–29)
Calcium: 9.2 mg/dL (ref 8.7–10.2)
Chloride: 103 mmol/L (ref 96–106)
Creatinine, Ser: 0.84 mg/dL (ref 0.57–1.00)
Globulin, Total: 3 g/dL (ref 1.5–4.5)
Glucose: 83 mg/dL (ref 70–99)
Potassium: 3.8 mmol/L (ref 3.5–5.2)
Sodium: 141 mmol/L (ref 134–144)
Total Protein: 7.1 g/dL (ref 6.0–8.5)
eGFR: 86 mL/min/{1.73_m2} (ref >59)

## 2024-02-02 LAB — LIPID PANEL
Cholesterol, Total: 152 mg/dL (ref 100–199)
HDL: 49 mg/dL (ref >39)
LDL CALC COMMENT:: 3.1 ratio (ref 0.0–4.4)
LDL Chol Calc (NIH): 93 mg/dL (ref 0–99)
Triglycerides: 46 mg/dL (ref 0–149)
VLDL Cholesterol Cal: 10 mg/dL (ref 5–40)

## 2024-02-02 LAB — RPR+HIV+GC+CT PANEL

## 2024-02-02 LAB — HEPATITIS C ANTIBODY

## 2024-02-02 LAB — HEPATITIS B SURFACE ANTIGEN

## 2024-02-02 LAB — TSH: TSH: 0.803 u[IU]/mL (ref 0.450–4.500)

## 2024-02-02 LAB — HEMOGLOBIN A1C
Est. average glucose Bld gHb Est-mCnc: 100 mg/dL
Hgb A1c MFr Bld: 5.1 % (ref 4.8–5.6)

## 2024-02-02 LAB — FSH/LH
FSH: 50 m[IU]/mL
LH: 39.3 m[IU]/mL

## 2024-02-05 ENCOUNTER — Other Ambulatory Visit: Payer: Managed Care, Other (non HMO)

## 2024-02-14 ENCOUNTER — Other Ambulatory Visit: Payer: Self-pay | Admitting: Medical

## 2024-02-14 ENCOUNTER — Telehealth: Payer: Self-pay | Admitting: Internal Medicine

## 2024-02-14 DIAGNOSIS — E559 Vitamin D deficiency, unspecified: Secondary | ICD-10-CM

## 2024-02-14 NOTE — Telephone Encounter (Signed)
 Received an email from pt-  How many days and hours did Dr Vincenza Hews give Gregary Signs for FLMA?  I need at least 3 days a month and 5 or 6 hours of office visits. If not will it be possible to redo the form please. It can be more days just not less because Gregary Signs goes through a lot with his Autism and days he not feeling good.  Looking at [facility] forms you said for incapacity( estimated episodic flare up)  episodes will be 1-3 times every 30 days and last up to 1-3 days.  Office visits 1 time every 90 days and approx. 5 hours.    Looks likes she is wanting 3 times in 30 days with 5-6 hours of office visits not sure if this needs to change for the part about Flare ups,.  Please advise- she will also need to pay for the FMLA form completion cost $29.00 per [facility] and return form to her

## 2024-02-14 NOTE — Telephone Encounter (Signed)
 Patient said everything was good. She didn't need it fixed

## 2024-02-19 ENCOUNTER — Other Ambulatory Visit: Payer: Self-pay | Admitting: Medical

## 2024-02-19 MED ORDER — HYDROQUINONE 6 % EX EMUL: 1.0000 | Freq: Every day | CUTANEOUS | 1 refills | Status: DC

## 2024-02-20 ENCOUNTER — Other Ambulatory Visit: Payer: Self-pay | Admitting: Medical

## 2024-02-20 MED ORDER — HYDROQUINONE 4 % EX CREA: TOPICAL_CREAM | Freq: Two times a day (BID) | CUTANEOUS | 2 refills | Status: DC

## 2024-03-24 ENCOUNTER — Other Ambulatory Visit: Payer: Self-pay | Admitting: Medical

## 2024-03-26 ENCOUNTER — Other Ambulatory Visit: Payer: Self-pay | Admitting: Medical

## 2024-03-26 MED ORDER — WEGOVY 2.4 MG/0.75ML ~~LOC~~ SOAJ
2.4000 mg | SUBCUTANEOUS | 1 refills | Status: DC
Start: 2024-03-26 — End: 2024-07-25

## 2024-05-03 ENCOUNTER — Other Ambulatory Visit: Payer: Self-pay | Admitting: Medical

## 2024-05-03 MED ORDER — WEGOVY 0.5 MG/0.5ML ~~LOC~~ SOAJ: 0.5000 mg | SUBCUTANEOUS | 0 refills | Status: DC

## 2024-05-03 MED ORDER — WEGOVY 0.25 MG/0.5ML ~~LOC~~ SOAJ: 0.2500 mg | SUBCUTANEOUS | 0 refills | Status: DC

## 2024-05-06 ENCOUNTER — Telehealth: Payer: Self-pay

## 2024-05-06 ENCOUNTER — Other Ambulatory Visit (HOSPITAL_COMMUNITY): Payer: Self-pay

## 2024-05-06 NOTE — Telephone Encounter (Signed)
 Pharmacy Patient Advocate Encounter   Received notification from CoverMyMeds that prior authorization for Wegovy  2.4MG /0.75ML auto-injectors is required/requested.   Insurance verification completed.   The patient is insured through [facility] .   Per test claim: PA required; PA submitted to above mentioned insurance via CoverMyMeds Key/confirmation #/EOC (Key: [id number])   Status is pending

## 2024-05-07 ENCOUNTER — Other Ambulatory Visit (HOSPITAL_COMMUNITY): Payer: Self-pay

## 2024-05-07 NOTE — Telephone Encounter (Signed)
 Update: An Appeal has been submitted as urgent as of 05/07/24. As the initial P/a was denied.

## 2024-05-07 NOTE — Telephone Encounter (Signed)
 Pharmacy Patient Advocate Encounter  Received notification from [employer] that Prior Authorization for Wegovy  2.4MG /0.75ML auto-injectors has been APPROVED  to 11.20.25. Ran test claim, Copay is $24.99. This test claim was processed through [facility]- copay amounts may vary at other pharmacies due to pharmacy/plan contracts, or as the patient moves through the different stages of their insurance plan.   PA #/Case ID/Reference #: (Key: [id number])

## 2024-05-27 ENCOUNTER — Other Ambulatory Visit: Payer: Self-pay | Admitting: Medical

## 2024-05-30 ENCOUNTER — Other Ambulatory Visit: Payer: Self-pay | Admitting: Medical

## 2024-06-11 ENCOUNTER — Ambulatory Visit: Admitting: Medical

## 2024-06-11 VITALS — BP 132/78 | HR 88 | Temp 98.2°F | Ht 67.0 in | Wt 205.0 lb

## 2024-06-11 DIAGNOSIS — M25611 Stiffness of right shoulder, not elsewhere classified: Secondary | ICD-10-CM | POA: Diagnosis not present

## 2024-06-11 DIAGNOSIS — M549 Dorsalgia, unspecified: Secondary | ICD-10-CM

## 2024-06-11 DIAGNOSIS — G8929 Other chronic pain: Secondary | ICD-10-CM

## 2024-06-11 DIAGNOSIS — M25511 Pain in right shoulder: Secondary | ICD-10-CM

## 2024-06-11 DIAGNOSIS — M778 Other enthesopathies, not elsewhere classified: Secondary | ICD-10-CM | POA: Diagnosis not present

## 2024-06-11 MED ORDER — IBUPROFEN 600 MG PO TABS: 600.0000 mg | ORAL_TABLET | Freq: Two times a day (BID) | ORAL | 0 refills | Status: DC | PRN

## 2024-06-11 MED ORDER — TIZANIDINE HCL 4 MG PO TABS
4.0000 mg | ORAL_TABLET | Freq: Every evening | ORAL | 0 refills | Status: DC | PRN
Start: 2024-06-11 — End: 2024-06-28

## 2024-06-11 NOTE — Progress Notes (Signed)
 Subjective:  Dawn Goodwin is a 48 y.o. female who presents for Chief Complaint  Patient presents with   Back Pain    Right sided mid-upper back pain since last pain and right arm pain x couple of months.      Here for right shoulder pain and right back pain.  No specific injury.  For several months she has had intermittent pains on the right side.  She is right-handed.  No fall.  No swelling or bruising.  No rash.  She does have pain with range of motion of the right shoulder.  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    She is under a lot of stress for now and wonders if that is contributing to her pain.  Since the pain has been worse she has to go to the bathroom right away if she has the urge to urinate.  No urinary leakage.  No burning with urination.  No cloudy urine or blood in the urine.  No other aggravating or relieving factors.    No other c/o.  The following portions of the patient's history were reviewed and updated as appropriate: allergies, current medications, past family history, past medical history, past social history, past surgical history and problem list.  ROS Otherwise as in subjective above  Objective: BP 132/78   Pulse 88   Temp 98.2 F (36.8 C) (Tympanic)   Ht 5' 7 (1.702 m)   Wt 205 lb (93 kg)   LMP 01/23/2024 (Approximate)   BMI 32.11 kg/m   General appearance: alert, no distress, well developed, well nourished Neck nontender normal range of motion She has tenderness in the right low back paraspinal lumbar region but otherwise nontender back normal range of motion of back.  No obvious bruising or swelling Tender over right anterior deltoid, tender over the right bicep, otherwise arm nontender.  Slight reduction in range of motion with internal and external right shoulder.  Mild pain with empty can test and there does seem to be weakness with rotator cuff testing.  No obvious labral test abnormality.  No clicking  or laxity. Arms neurovascularly intact   Assessment: Encounter Diagnoses  Name Primary?   Chronic right shoulder pain Yes   Shoulder tendonitis, right    Mid back pain on right side    Decreased ROM of right shoulder      Plan: I suspect you have right shoulder and arm tendinitis.  There is the possibility of rotator cuff partial tear but I suspect more of a tendinitis issue  Recommendations: Begin ibuprofen  600 mg twice daily for 5 to 7 days for pain and inflammation You can use tizanidine  muscle laxer at night, 1/2 to 1 tablet for the next 2 nights for muscle spasm and tension in the shoulder and back Consider using an arm sling periodically throughout the day to give the arm and shoulder some rest Avoid heavy lifting or strenuous activity with the shoulder for the next several days Use and stretching of the shoulder daily as discussed Use stretching and shoulder strengthening exercises as demonstrated Recheck in 2 to 3 weeks  If not improving at all over the next few weeks we may need to get physical therapy involved   [name] was seen today for back pain.  Diagnoses and all orders for this visit:  Chronic right shoulder pain  Shoulder tendonitis, right  Mid back pain on right side  Decreased ROM of right shoulder  Other  orders -     ibuprofen  (ADVIL ) 600 MG tablet; Take 1 tablet (600 mg total) by mouth 2 (two) times daily as needed. -     tiZANidine  (ZANAFLEX ) 4 MG tablet; Take 1 tablet (4 mg total) by mouth at bedtime as needed for muscle spasms.    Follow up: prn

## 2024-06-11 NOTE — Patient Instructions (Addendum)
 I suspect you have right shoulder and arm tendinitis.  There is the possibility of rotator cuff partial tear but I suspect more of a tendinitis issue  Recommendations: Begin ibuprofen  600 mg twice daily for 5 to 7 days for pain and inflammation You can use tizanidine  muscle laxer at night, 1/2 to 1 tablet for the next 2 nights for muscle spasm and tension in the shoulder and back Consider using an arm sling periodically throughout the day to give the arm and shoulder some rest Avoid heavy lifting or strenuous activity with the shoulder for the next several days Use and stretching of the shoulder daily as discussed Use stretching and shoulder strengthening exercises as demonstrated Recheck in 2 to 3 weeks  If not improving at all over the next few weeks we may need to get physical therapy involved  Exercises for Unstable Shoulder From Injury: Strengthening Intro Shoulder exercises can help you get better after an injury. Only do the exercises you were told to do. Make sure you know how to do the exercises safely. Follow the steps below. It's normal to feel mild discomfort. Stop if you feel pain or your pain gets worse. Do not start these exercises until told by your health care provider. Exercises for strength These exercises build strength and endurance in your arm and shoulder. Endurance means being able to use your muscles for longer, even after they get tired. For isometric exercises, you push or pull against something that doesn't move. This helps your muscles get stronger without working your body much. Scapular depression and retraction, isometric After you have practiced this exercise, try doing it without the arm support. Then, try doing it while standing instead of sitting. Sit on a stable chair. Support your arms in front of you with pillows, armrests, or a tabletop. Keep your elbows near the sides of your body. Gently move your shoulder blades down and back toward your spine. Relax  the muscles on the tops of your shoulders and in the back of your neck while doing this exercise. Hold for __________ seconds. Slowly release the tension, and relax your muscles completely before you repeat the exercise. Repeat __________ times. Do this exercise __________ times a day. External rotation, isometric This is an exercise in which you press the back of your wrist against a door frame without moving your shoulder joint. Stand or sit in a doorway, facing the door frame. Bend your left / right elbow and place the back of your wrist against the door frame. Only the back of your wrist should be touching the frame. Keep your upper arm at your side. Gently press your wrist against the door frame, as if you're trying to push your arm away from your belly. Gradually increase the pressure until you're pressing as hard as you can. Stop increasing the pressure if you feel pain. Avoid shrugging your shoulder while you press your wrist into the door frame. Keep your shoulder blade tucked down toward the middle of your back. Hold for __________ seconds. Slowly release the tension, and relax your muscles completely before you repeat the exercise. Repeat __________ times. Do this exercise __________ times a day. Scapular protraction, isotonic, standing  Stand so you're facing a wall. Place your feet about one arm-length away from the wall. Place your hands on the wall and straighten your elbows. Keep your hands on the wall as you push your upper back away from the wall. You should feel your shoulder blades sliding forward around your  rib cage. Keep your elbows and your head still. If you're not sure that you're doing this exercise correctly, ask your provider for more instructions. Hold for __________ seconds. Slowly return to the starting position. Let your muscles relax completely before you repeat this exercise. Repeat __________ times. Do this exercise __________ times a day. Shoulder extension,  isotonic, prone  Lie on your belly (prone) on a firm surface so your left / right arm hangs over the edge. Hold a __________ lb / kg weight in your hand so your palm faces in toward your body. Your arm should be straight Squeeze your shoulder blade down toward the middle of your back. Slowly raise your arm behind you, up to the height of the surface that you're lying on. Keep your arm straight. Slowly return to the starting position and relax your muscles. Repeat __________ times. Do this exercise __________ times a day. This information is not intended to replace advice given to you by your health care provider. Make sure you discuss any questions you have with your health care provider. Document Revised: 09/11/2023 Document Reviewed: 09/11/2023 Elsevier Patient Education  2025 [employer].

## 2024-06-17 ENCOUNTER — Telehealth: Payer: Self-pay | Admitting: Internal Medicine

## 2024-06-17 NOTE — Telephone Encounter (Signed)
 Spoke to patient and she is trying to increase dose to 0.5mg . advised her that she can not use a 0.25mg  dose to twist like she has with ozempic. Ok to refill 0.5mg     Copied from CRM [id number]. Topic: Clinical - Prescription Issue >> Jun 17, 2024 11:33 AM Wess RAMAN wrote: Reason for CRM: Patient states Tysinger, Alm informed her that she should be able to twist WEGOVY  0.25 MG/0.5ML SOAJ and she is not.  Callback #: [phone]

## 2024-06-18 MED ORDER — WEGOVY 0.5 MG/0.5ML ~~LOC~~ SOAJ: 0.5000 mg | SUBCUTANEOUS | 0 refills | Status: DC

## 2024-06-18 MED ORDER — WEGOVY 1 MG/0.5ML ~~LOC~~ SOAJ: 1.0000 mg | SUBCUTANEOUS | 0 refills | Status: DC

## 2024-06-18 NOTE — Telephone Encounter (Signed)
 Sent 0.5mg  and 1mg  to pharmacy  Pt was notified to let us  know before running out of 1mg  so we can send In new rx for higher doses

## 2024-06-24 ENCOUNTER — Other Ambulatory Visit: Payer: Self-pay | Admitting: Medical

## 2024-06-24 ENCOUNTER — Ambulatory Visit: Payer: Self-pay | Admitting: Medical

## 2024-06-24 DIAGNOSIS — Z1231 Encounter for screening mammogram for malignant neoplasm of breast: Secondary | ICD-10-CM

## 2024-06-24 NOTE — Telephone Encounter (Signed)
 FYI Only or Action Required?: Action required by provider: update on patient condition.  Patient was last seen in primary care on 06/11/2024 by Bulah Alm RAMAN, PA-C.  Called Nurse Triage reporting Pain.  Symptoms began several weeks ago.  Interventions attempted: Prescription medications: ibuprofen  (ADVIL ) 600 MG tablet.  Symptoms are: gradually worsening.  Triage Disposition: See PCP When Office is Open (Within 3 Days)  Patient/caregiver understands and will follow disposition?: Yes                       Copied from CRM [id number]. Topic: Clinical - Red Word Triage >> Jun 24, 2024  2:58 PM Rachelle R wrote: Red Word that prompted transfer to Nurse Triage: Patient has been experiencing pain in her right side right arm and left leg for the past few weeks. Was in a few weeks for the pain on her right side but the medicine prescribed does not help. Reason for Disposition  [1] MODERATE pain (e.g., interferes with normal activities) AND [2] present > 3 days  Answer Assessment - Initial Assessment Questions This RN scheduled pt for appointment on Friday, 7/11, with PCP in office. Pt does not want to see another provider or go to urgent care.     ONSET: When did the pain start?     3-4 weeks ago; pt was seen in office recently LOCATION: Where is the pain located?     Right arm and ride side PAIN: How bad is the pain? (Scale 1-10; or mild, moderate, severe)   - MILD (1-3): Doesn't interfere with normal activities.   - MODERATE (4-7): Interferes with normal activities (e.g., work or school) or awakens from sleep.   - SEVERE (8-10): Excruciating pain, unable to do any normal activities, unable to hold a cup of water.     10/10 pain level intermittent becomes sharp when holding urine; constant pain 7/10 pain level CAUSE: What do you think is causing the arm pain?     Not sure Denies pain with urination, swelling, numbness Pain down left leg  Protocols used:  Arm Pain-A-AH

## 2024-06-24 NOTE — Telephone Encounter (Signed)
 Pt is scheduled for Friday. Offered earlier appt but she has other appts and can't come

## 2024-06-28 ENCOUNTER — Ambulatory Visit (INDEPENDENT_AMBULATORY_CARE_PROVIDER_SITE_OTHER): Admitting: Medical

## 2024-06-28 ENCOUNTER — Encounter: Payer: Self-pay | Admitting: Medical

## 2024-06-28 VITALS — BP 122/76 | HR 84 | Wt 206.8 lb

## 2024-06-28 DIAGNOSIS — M25511 Pain in right shoulder: Secondary | ICD-10-CM | POA: Diagnosis not present

## 2024-06-28 DIAGNOSIS — M7521 Bicipital tendinitis, right shoulder: Secondary | ICD-10-CM | POA: Diagnosis not present

## 2024-06-28 DIAGNOSIS — M549 Dorsalgia, unspecified: Secondary | ICD-10-CM

## 2024-06-28 DIAGNOSIS — R29898 Other symptoms and signs involving the musculoskeletal system: Secondary | ICD-10-CM | POA: Diagnosis not present

## 2024-06-28 DIAGNOSIS — G8929 Other chronic pain: Secondary | ICD-10-CM

## 2024-06-28 MED ORDER — IBUPROFEN 600 MG PO TABS: 600.0000 mg | ORAL_TABLET | Freq: Two times a day (BID) | ORAL | 0 refills | Status: DC | PRN

## 2024-06-28 MED ORDER — TIZANIDINE HCL 4 MG PO TABS
4.0000 mg | ORAL_TABLET | Freq: Two times a day (BID) | ORAL | 0 refills | Status: DC | PRN
Start: 2024-06-28 — End: 2024-08-30

## 2024-06-28 NOTE — Progress Notes (Signed)
 Subjective:  Dawn Goodwin is a 48 y.o. female who presents for Chief Complaint  Patient presents with   Acute Visit    Pain in right arm. And also pain in back for a month, lower back, rt. Upper arm and shoulder area is where the pain is located,      Here for ongoing right arm pain.  I saw her for similar and shoulder pain 06/11/24.    She notes some improvements in taking the medication from last visit but when she is not taking medicine it seems to hurt worse.  No major improvement since last visit.  She still has ongoing left shoulder pain and right arm pain and bicep pain for a few months.  She did not use arm sling as advised last visit.  She is still working full-time  She has pain is mainly in the shoulder and the bicep but no other arm pain.  It hurts to use the arm overhead motion.  She has to use her left arm to lift her right arm at times.  She still has mid to low back pain on the right.  Pain with twisting.  She gets pain in the shoulder and the at night when trying to sleep  Occasion she gets pains in either leg but no consistent leg pain or weakness or numbness or tingling.  No other aggravating or relieving factors.    No other c/o.   The following portions of the patient's history were reviewed and updated as appropriate: allergies, current medications, past family history, past medical history, past social history, past surgical history and problem list.  ROS Otherwise as in subjective above  Objective: BP 122/76   Pulse 84   Wt 206 lb 12.8 oz (93.8 kg)   LMP 01/23/2024 (Approximate)   BMI 32.39 kg/m   General appearance: alert, no distress, well developed, well nourished Neck nontender normal range of motion She has tenderness in the right mid and  low back paraspinal lumbar region but otherwise nontender back normal range of motion of back.  No obvious bruising or swelling Tender over right anterior deltoid, tender over the right bicep, otherwise arm  nontender.  Slight reduction in range of motion with internal and external right shoulder.  Mild pain with empty can test and there does seem to be weakness with rotator cuff testing.  No obvious labral test abnormality.  No clicking or laxity. Arms neurovascularly intact   Assessment: Encounter Diagnoses  Name Primary?   Biceps tendinitis of right upper extremity Yes   Chronic right shoulder pain    Shoulder weakness    Right-sided back pain, unspecified back location, unspecified chronicity       Plan: No major improvement since last visit.  Referral today for sports medicine.  Advise she get the arm sling as advised last visit and start using it some particular of the weekend.  Use relative rest and stretching as discussed.  Continue ibuprofen  twice daily as needed, tizanidine  once or twice daily as needed.  Discussed stretching, relative rest  Consider massage.  If access to a pool , then use some pool exercise this weekend   [facility] today for acute visit.  Diagnoses and all orders for this visit:  Biceps tendinitis of right upper extremity -     Ambulatory referral to Sports Medicine  Chronic right shoulder pain -     Ambulatory referral to Sports Medicine  Shoulder weakness -     Ambulatory referral  to Sports Medicine  Right-sided back pain, unspecified back location, unspecified chronicity -     Ambulatory referral to Sports Medicine  Other orders -     ibuprofen  (ADVIL ) 600 MG tablet; Take 1 tablet (600 mg total) by mouth 2 (two) times daily as needed. -     tiZANidine  (ZANAFLEX ) 4 MG tablet; Take 1 tablet (4 mg total) by mouth 2 (two) times daily as needed for muscle spasms.     Follow up: pending referral

## 2024-07-01 ENCOUNTER — Telehealth: Payer: Self-pay | Admitting: Internal Medicine

## 2024-07-01 NOTE — Telephone Encounter (Signed)
 Can you please ask Dr. Ludie do I need to schedule another appointment with him because the pain is on my side and when I hold my pee it hurts worst. Is that between my pelvis and rib? I don't really know however I can feel the pain from my lower back to my side and it went to the front like under my stomach.

## 2024-07-01 NOTE — Telephone Encounter (Signed)
 Pt is not having any urinary symptoms. Certain ways she moves she feels pain in her back and then will radiate to pelvic area. Patient was advise to use the tizadine and see if that helps with the pain but trying to rest and not do a lot of movement incase she did strain or pull that area. 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

## 2024-07-05 ENCOUNTER — Ambulatory Visit

## 2024-07-08 ENCOUNTER — Ambulatory Visit (INDEPENDENT_AMBULATORY_CARE_PROVIDER_SITE_OTHER): Admitting: Family Medicine

## 2024-07-08 ENCOUNTER — Other Ambulatory Visit: Payer: Self-pay

## 2024-07-08 VITALS — BP 128/84 | Ht 67.0 in | Wt 206.0 lb

## 2024-07-08 DIAGNOSIS — M75101 Unspecified rotator cuff tear or rupture of right shoulder, not specified as traumatic: Secondary | ICD-10-CM | POA: Diagnosis not present

## 2024-07-08 DIAGNOSIS — M7541 Impingement syndrome of right shoulder: Secondary | ICD-10-CM

## 2024-07-08 DIAGNOSIS — M67911 Unspecified disorder of synovium and tendon, right shoulder: Secondary | ICD-10-CM

## 2024-07-08 DIAGNOSIS — M545 Low back pain, unspecified: Secondary | ICD-10-CM

## 2024-07-08 MED ORDER — MELOXICAM 15 MG PO TABS: 15.0000 mg | ORAL_TABLET | Freq: Every day | ORAL | 1 refills | Status: DC

## 2024-07-08 NOTE — Patient Instructions (Addendum)
 You have a small rotator cuff tear in one of the muscles with impingement. Take meloxicam  15mg  daily with food for pain and inflammation (don't take ibuprofen  or aleve while on this). Start physical therapy. Do home exercises on days you don't go to therapy. Ice 15 minutes at a time as needed. Follow up in 5-6 weeks for reevaluation. Try to avoid overhead motions, lifting with an extended arm as much as possible.  [facility] Located in: [facility] [address], [place], [place] [address] Phone: [phone]

## 2024-07-08 NOTE — Progress Notes (Cosign Needed)
 PCP: Bulah Alm RAMAN, PA-C  Subjective:   HPI: Patient is a 48 y.o. female referred by PCP for right shoulder and back pain.  Shoulder pain  Pain has been going on for a few months. Patient says she has to use support of her left arm to be able to lift up her right arm. She describes a sharp pain in her anterior shoulder that constantly hurts. Hurts most first in the morning when she wakes up at that time she notices numbness from her anterior shoulder to her elbow.  No alleviating factors. Muscle relaxer and ibuprofen  did not help.  No pain on left side.  Denies any trauma to the area.  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   Back Pain  Patient describes right sided back pain. She used to have pain in this area when she hurt herself in her 61s when she was lifting something heavy on the job. The pain went away, but recently came back a month ago. She mentions having had injections and physical therapy for this pain initially.  Pain is worse in the morning. Hot pad did not help. Laying down makes the pain worse.  Occasionally feels some sharp shooting pains in her legs.  She does endorse urinary frequency when her back pain is worse. She says that when she urinates her side hurts less and feels like some pressure has been released.  Denies fevers.  Denies incontinence or perineal numbness.    Past Medical History:  Diagnosis Date   Acne    Back pain 2002   went through PT    GERD (gastroesophageal reflux disease)    intermittent   Headache    History of UTI    once prior as of 03/2015   Knee pain 2007   right   Pregnancy induced hypertension    Seizure disorder (HCC)    from infancy til age 59yo, on medication during that time, etiology unclear?   Wears glasses     Current Outpatient Medications on File Prior to Visit  Medication Sig Dispense Refill   dimenhyDRINATE (MOTION SICKNESS PO) Take by mouth.     hydroquinone  4 %  cream APPLY  CREAM TOPICALLY TWICE DAILY 29 g 0   Hydroquinone  6 % EMUL Apply 1 Application topically daily. 30 g 1   ibuprofen  (ADVIL ) 600 MG tablet Take 1 tablet (600 mg total) by mouth 2 (two) times daily as needed. 30 tablet 0   ondansetron  (ZOFRAN -ODT) 4 MG disintegrating tablet Take 1 tablet (4 mg total) by mouth every 8 (eight) hours as needed for nausea or vomiting. 20 tablet 0   Semaglutide -Weight Management (WEGOVY ) 0.5 MG/0.5ML SOAJ Inject 0.5 mg into the skin once a week. 2 mL 0   [START ON 07/16/2024] Semaglutide -Weight Management (WEGOVY ) 1 MG/0.5ML SOAJ Inject 1 mg into the skin once a week. (Patient not taking: Reported on 06/28/2024) 2 mL 0   Semaglutide -Weight Management (WEGOVY ) 1.7 MG/0.75ML SOAJ Inject 1.7 mg into the skin once a week. (Patient not taking: Reported on 06/28/2024) 3 mL 1   Semaglutide -Weight Management (WEGOVY ) 2.4 MG/0.75ML SOAJ Inject 2.4 mg into the skin once a week. (Patient not taking: Reported on 06/28/2024) 3 mL 1   tiZANidine  (ZANAFLEX ) 4 MG tablet Take 1 tablet (4 mg total) by mouth 2 (two) times daily as needed for muscle spasms. 15 tablet 0   No current facility-administered medications on file prior to visit.  Past Surgical History:  Procedure Laterality Date   CESAREAN SECTION  12/19/2004   DILATION AND CURETTAGE OF UTERUS  12/20/2011   DILATION AND EVACUATION  12/26/2011   Procedure: DILATATION AND EVACUATION;  Surgeon: Lamar CHRISTELLA Spence;  Location: [facility];  Service: Gynecology;  Laterality: N/A;    Allergies  Allergen Reactions   Cyclinex [Tetracycline Hcl] Swelling    Face, hands, feet    BP 128/84   Ht 5' 7 (1.702 m)   Wt 206 lb (93.4 kg)   LMP 01/23/2024 (Approximate)   BMI 32.26 kg/m       No data to display              No data to display              Objective:  Physical Exam:  Gen: NAD, comfortable in exam room Abd: soft, non distended, non tender, no cva tenderness  MSK:  Right arm: No obvious deformities,  No tenderness to palpation of shoulder or biceps groove.  Lacks about 20 degrees abduction/flexion but full IR/ER.  Strength 5/5 IR/ER, 5-/5 empty can.  Neer's test positive, elicited pain   Jobe's test positive, elicited pain  Lift off test positive with pain  Painful arc test positive  Yergason's test: negative  Left arm: No obvious deformities or tenderness.   Neer's test negative   Jobe's test negative   Lift off negative   Painful arc test negative   Yergason's negative  Low back: Right paravertebral muscle hypertrophied and tender to palpation  Straight leg test negative bilaterally  No midline spinal tenderness  No Lower extremity weakness    Limited MSK u/s right shoulder:  Subscapularis and infraspinatus appear intact.  Supraspinatus at anterior aspect appears very thin with anechoic change consistent with small partial width full thickness tear.  Assessment & Plan:   Assessment & Plan Rotator cuff impingement syndrome of right shoulder Tear of right supraspinatus tendon Shoulder pain most likely due to rotator cuff impingement given difficulty with abduction. Unlikely biceps involvement as supination does not cause pain. Ultrasound showed partial tear of supraspinatus tendon. No full thickness tears.  - meloxicam  for 7 days  - physical therapy for supraspinatus rehab  Back pain, lumbosacral Back pain most likely musculoskeletal in nature, no neurologic alarm signs.  - Recommended lidocaine  patch and heating pad  - meloxicam  for rotator cuff should help alleviate some pain  - Recommended stretches for back pain as well as core strengthening.

## 2024-07-09 ENCOUNTER — Encounter: Payer: Self-pay | Admitting: Medical

## 2024-07-09 ENCOUNTER — Encounter: Payer: Self-pay | Admitting: Family Medicine

## 2024-07-09 ENCOUNTER — Ambulatory Visit (INDEPENDENT_AMBULATORY_CARE_PROVIDER_SITE_OTHER): Admitting: Medical

## 2024-07-09 VITALS — BP 118/70 | HR 88 | Temp 98.5°F | Ht 67.0 in | Wt 202.0 lb

## 2024-07-09 DIAGNOSIS — R109 Unspecified abdominal pain: Secondary | ICD-10-CM | POA: Diagnosis not present

## 2024-07-09 DIAGNOSIS — Z131 Encounter for screening for diabetes mellitus: Secondary | ICD-10-CM

## 2024-07-09 DIAGNOSIS — R21 Rash and other nonspecific skin eruption: Secondary | ICD-10-CM

## 2024-07-09 DIAGNOSIS — R350 Frequency of micturition: Secondary | ICD-10-CM | POA: Diagnosis not present

## 2024-07-09 DIAGNOSIS — Z6831 Body mass index (BMI) 31.0-31.9, adult: Secondary | ICD-10-CM

## 2024-07-09 DIAGNOSIS — R1030 Lower abdominal pain, unspecified: Secondary | ICD-10-CM

## 2024-07-09 LAB — POCT URINALYSIS DIP (PROADVANTAGE DEVICE)
Glucose, UA: NEGATIVE mg/dL
Ketones, POC UA: NEGATIVE mg/dL
Nitrite, UA: NEGATIVE
Protein Ur, POC: NEGATIVE mg/dL
Specific Gravity, Urine: 1.02
Urobilinogen, Ur: 0.2
pH, UA: 6 (ref 5.0–8.0)

## 2024-07-09 LAB — POCT URINE PREGNANCY: Preg Test, Ur: NEGATIVE

## 2024-07-09 MED ORDER — NITROFURANTOIN MONOHYD MACRO 100 MG PO CAPS
100.0000 mg | ORAL_CAPSULE | Freq: Two times a day (BID) | ORAL | 0 refills | Status: DC
Start: 2024-07-09 — End: 2024-08-30

## 2024-07-09 MED ORDER — TRIAMCINOLONE ACETONIDE 0.1 % EX CREA: 1.0000 | TOPICAL_CREAM | Freq: Two times a day (BID) | CUTANEOUS | 1 refills | Status: AC

## 2024-07-09 NOTE — Progress Notes (Signed)
 Subjective:  Dawn Goodwin is a 48 y.o. female who presents for Chief Complaint  Patient presents with   Flank Pain    Right side pain x 4 weeks. Urinary frequency x 2 weeks. Rash around her neck that has been there for a month.      Here for pain in right side of abdomen for about a month.  Pain right lower side.  She notes urinary frequency for 2 weeks, but no blood in urine, strong urine odor.  Urine a little darker yellow.  Has some lower abdominal pain as well.  No nausea.  Hurts in flank to twist.  No fever.  Hurts to hold urine too long.  Last UTI unsure.  No vaginal discharge.  LMP 01/2024.  She notes rash around neck for months.  It started to resolve but then came back  No other aggravating or relieving factors.    No other c/o.  Past Medical History:  Diagnosis Date   Acne    Back pain 2002   went through PT    GERD (gastroesophageal reflux disease)    intermittent   Headache    History of UTI    once prior as of 03/2015   Knee pain 2007   right   Pregnancy induced hypertension    Seizure disorder (HCC)    from infancy til age 75yo, on medication during that time, etiology unclear?   Wears glasses    Current Outpatient Medications on File Prior to Visit  Medication Sig Dispense Refill   ondansetron  (ZOFRAN -ODT) 4 MG disintegrating tablet Take 1 tablet (4 mg total) by mouth every 8 (eight) hours as needed for nausea or vomiting. 20 tablet 0   Semaglutide -Weight Management (WEGOVY ) 0.5 MG/0.5ML SOAJ Inject 0.5 mg into the skin once a week. 2 mL 0   dimenhyDRINATE (MOTION SICKNESS PO) Take by mouth. (Patient not taking: Reported on 07/09/2024)     hydroquinone  4 % cream APPLY  CREAM TOPICALLY TWICE DAILY (Patient not taking: Reported on 07/09/2024) 29 g 0   Hydroquinone  6 % EMUL Apply 1 Application topically daily. (Patient not taking: Reported on 07/09/2024) 30 g 1   ibuprofen  (ADVIL ) 600 MG tablet Take 1 tablet (600 mg total) by mouth 2 (two) times daily as needed.  (Patient not taking: Reported on 07/09/2024) 30 tablet 0   meloxicam  (MOBIC ) 15 MG tablet Take 1 tablet (15 mg total) by mouth daily. (Patient not taking: Reported on 07/09/2024) 30 tablet 1   [START ON 07/16/2024] Semaglutide -Weight Management (WEGOVY ) 1 MG/0.5ML SOAJ Inject 1 mg into the skin once a week. (Patient not taking: Reported on 07/09/2024) 2 mL 0   Semaglutide -Weight Management (WEGOVY ) 1.7 MG/0.75ML SOAJ Inject 1.7 mg into the skin once a week. (Patient not taking: Reported on 07/09/2024) 3 mL 1   Semaglutide -Weight Management (WEGOVY ) 2.4 MG/0.75ML SOAJ Inject 2.4 mg into the skin once a week. (Patient not taking: Reported on 07/09/2024) 3 mL 1   tiZANidine  (ZANAFLEX ) 4 MG tablet Take 1 tablet (4 mg total) by mouth 2 (two) times daily as needed for muscle spasms. (Patient not taking: Reported on 07/09/2024) 15 tablet 0   No current facility-administered medications on file prior to visit.     The following portions of the patient's history were reviewed and updated as appropriate: allergies, current medications, past family history, past medical history, past social history, past surgical history and problem list.  ROS Otherwise as in subjective above    Objective: BP 118/70  Pulse 88   Temp 98.5 F (36.9 C) (Tympanic)   Ht 5' 7 (1.702 m)   Wt 202 lb (91.6 kg)   LMP 01/23/2024 (Approximate)   BMI 31.64 kg/m   Wt Readings from Last 3 Encounters:  07/09/24 202 lb (91.6 kg)  07/08/24 206 lb (93.4 kg)  06/28/24 206 lb 12.8 oz (93.8 kg)   General appearance: alert, no distress, well developed, well nourished Skin: Darker brownish coloration, rough skin along the left and right posterior lateral neck suggestive of acanthosis nigricans Abdomen: +bs, soft, mild tenderness suprapubic and right lower quadrant, otherwise non tender, non distended, no masses, no hepatomegaly, no splenomegaly Tender along right lateral flank/abdomen Back nontender Pulses: 2+ radial pulses, 2+ pedal  pulses, normal cap refill Ext: no edema   Assessment: Encounter Diagnoses  Name Primary?   Lower abdominal pain Yes   Flank pain    Urinary frequency    Rash    BMI 31.0-31.9,adult    Screening for diabetes mellitus      Plan: Rash-could be something called acanthosis nigricans related to insulin  resistance and prediabetes.  However, given that has not been the longest lasting triamcinolone  cream twice daily for the next week to see if this resolves the rash   Urinary frequency, abdominal and flank pain We will send your urine for culture as this could be early signs of urinary tract infection I am sending antibiotic to your pharmacy in the event the symptoms worsen in the next few days such as worse pain, fever, chills, worse belly pain, odor in urine, cloudy urine or blood in urine suggesting infection Drink 80 to 100 ounces of water daily and add some lemon to your water over the next few days You can use Tylenol once or twice over the next few days for pain If new or worse symptoms in the next 48 hours let me know   Stretch daily over the next few days to ease some of the pain   If severe pain in the right lower quadrant, fever or much sicker feeling in the next 48 hours, then get reevaluated   Dawn Goodwin was seen today for flank pain.  Diagnoses and all orders for this visit:  Lower abdominal pain -     POCT Urinalysis DIP (Proadvantage Device) -     POCT urine pregnancy -     Urine Culture  Flank pain -     POCT Urinalysis DIP (Proadvantage Device) -     POCT urine pregnancy  Urinary frequency -     POCT Urinalysis DIP (Proadvantage Device) -     POCT urine pregnancy -     Urine Culture  Rash -     Insulin , random -     Hemoglobin A1c  BMI 31.0-31.9,adult -     Insulin , random -     Hemoglobin A1c  Screening for diabetes mellitus -     Insulin , random -     Hemoglobin A1c  Other orders -     triamcinolone  cream (KENALOG ) 0.1 %; Apply 1 Application  topically 2 (two) times daily. -     nitrofurantoin , macrocrystal-monohydrate, (MACROBID ) 100 MG capsule; Take 1 capsule (100 mg total) by mouth 2 (two) times daily.    Follow up: pending labs

## 2024-07-09 NOTE — Patient Instructions (Addendum)
 Rash-could be something called acanthosis nigricans related to insulin  resistance and prediabetes.  However, given that has not been the longest lasting triamcinolone  cream twice daily for the next week to see if this resolves the rash   Urinary frequency, abdominal and flank pain We will send your urine for culture as this could be early signs of urinary tract infection I am sending antibiotic to your pharmacy in the event the symptoms worsen in the next few days such as worse pain, fever, chills, worse belly pain, odor in urine, cloudy urine or blood in urine suggesting infection Drink 80 to 100 ounces of water daily and add some lemon to your water over the next few days You can use Tylenol once or twice over the next few days for pain If new or worse symptoms in the next 48 hours let me know   Stretch daily over the next few days to ease some of the pain   If severe pain in the right lower quadrant, fever or much sicker feeling in the next 48 hours, then get reevaluated

## 2024-07-10 ENCOUNTER — Ambulatory Visit: Admitting: Medical

## 2024-07-10 ENCOUNTER — Ambulatory Visit: Payer: Self-pay | Admitting: Medical

## 2024-07-10 ENCOUNTER — Other Ambulatory Visit: Payer: Self-pay | Admitting: Medical

## 2024-07-10 LAB — HEMOGLOBIN A1C
Est. average glucose Bld gHb Est-mCnc: 105 mg/dL
Hgb A1c MFr Bld: 5.3 % (ref 4.8–5.6)

## 2024-07-10 LAB — INSULIN, RANDOM: INSULIN: 15.4 u[IU]/mL (ref 2.6–24.9)

## 2024-07-10 MED ORDER — FLUCONAZOLE 150 MG PO TABS: 150.0000 mg | ORAL_TABLET | Freq: Once | ORAL | 0 refills | Status: AC

## 2024-07-10 NOTE — Progress Notes (Signed)
Results through My Chart

## 2024-07-11 NOTE — Progress Notes (Signed)
 Results sent through MyChart

## 2024-07-12 LAB — URINE CULTURE

## 2024-07-15 NOTE — Progress Notes (Signed)
 Results thru my chart

## 2024-07-16 ENCOUNTER — Ambulatory Visit

## 2024-07-25 ENCOUNTER — Telehealth: Payer: Self-pay | Admitting: Internal Medicine

## 2024-07-25 DIAGNOSIS — E559 Vitamin D deficiency, unspecified: Secondary | ICD-10-CM | POA: Diagnosis not present

## 2024-07-25 DIAGNOSIS — Z1389 Encounter for screening for other disorder: Secondary | ICD-10-CM | POA: Diagnosis not present

## 2024-07-25 DIAGNOSIS — Z01419 Encounter for gynecological examination (general) (routine) without abnormal findings: Secondary | ICD-10-CM | POA: Diagnosis not present

## 2024-07-25 DIAGNOSIS — Z130 Encounter for screening for diseases of the blood and blood-forming organs and certain disorders involving the immune mechanism: Secondary | ICD-10-CM | POA: Diagnosis not present

## 2024-07-25 DIAGNOSIS — Z113 Encounter for screening for infections with a predominantly sexual mode of transmission: Secondary | ICD-10-CM | POA: Diagnosis not present

## 2024-07-25 MED ORDER — WEGOVY 2.4 MG/0.75ML ~~LOC~~ SOAJ: 2.4000 mg | SUBCUTANEOUS | 1 refills | Status: DC

## 2024-07-25 MED ORDER — WEGOVY 1.7 MG/0.75ML ~~LOC~~ SOAJ: 1.7000 mg | SUBCUTANEOUS | 0 refills | Status: DC

## 2024-07-25 MED ORDER — WEGOVY 1 MG/0.5ML ~~LOC~~ SOAJ: 1.0000 mg | SUBCUTANEOUS | 0 refills | Status: DC

## 2024-07-25 NOTE — Telephone Encounter (Signed)
 These were already sent in but  I went ahead and resent this today.    Copied from CRM [id number]. Topic: Clinical - Prescription Issue >> Jul 25, 2024 10:48 AM Dawn Goodwin wrote: Reason for CRM: Patient went to pick up the (WEGOVY ) medication, and it was 0.5 and she stated she just finished that and it was suppose to be the next dose up. Please advise patient and patient would also like the nurse to call her as well.

## 2024-07-26 ENCOUNTER — Other Ambulatory Visit: Payer: Self-pay | Admitting: Medical

## 2024-07-30 ENCOUNTER — Ambulatory Visit

## 2024-08-09 ENCOUNTER — Ambulatory Visit: Admitting: Medical

## 2024-08-09 VITALS — BP 122/82 | HR 78 | Wt 201.0 lb

## 2024-08-09 DIAGNOSIS — R0781 Pleurodynia: Secondary | ICD-10-CM | POA: Diagnosis not present

## 2024-08-09 DIAGNOSIS — R109 Unspecified abdominal pain: Secondary | ICD-10-CM

## 2024-08-09 LAB — POCT URINALYSIS DIP (PROADVANTAGE DEVICE)
Bilirubin, UA: NEGATIVE
Blood, UA: NEGATIVE
Glucose, UA: NEGATIVE mg/dL
Ketones, POC UA: NEGATIVE mg/dL
Leukocytes, UA: NEGATIVE
Nitrite, UA: NEGATIVE
Protein Ur, POC: NEGATIVE mg/dL
Specific Gravity, Urine: 1.02
Urobilinogen, Ur: 0.2
pH, UA: 6 (ref 5.0–8.0)

## 2024-08-09 NOTE — Progress Notes (Signed)
 Subjective:  Dawn Goodwin is a 48 y.o. female who presents for Chief Complaint  Patient presents with   Acute Visit    Side pain, feeling the need to urinate often, every hour, chest and back pain, belives the pain from chest and back pain is from her arm. Wants to have a urine test done.        Here for pain in the right side.  She was seen here recently for similar.  She ended up using antibiotic urinary tract infection.  She did improve but she is still having a pain in her right chest wall.  No specific injury or trauma.  She does exercise regularly.  She does weightbearing exercises well.  She has history of rotator cuff issues and is seeing therapy regarding shoulder pains.  Her pain in her right side is worse if she holds her urine and does not get to the bathroom.  No pain after eating.  No recent injury or trauma or fall.  No bruising.  It does hurt to twist  No urinary frequency, urgency, odor in urine or blood in urine.  No bowel movement changes or problems with bowels.  No cough or URI symptoms.  No other aggravating or relieving factors.    No other c/o.  Past Medical History:  Diagnosis Date   Acne    Back pain 2002   went through PT    GERD (gastroesophageal reflux disease)    intermittent   Headache    History of UTI    once prior as of 03/2015   Knee pain 2007   right   Pregnancy induced hypertension    Seizure disorder (HCC)    from infancy til age 57yo, on medication during that time, etiology unclear?   Wears glasses    Current Outpatient Medications on File Prior to Visit  Medication Sig Dispense Refill   dimenhyDRINATE (MOTION SICKNESS PO) Take by mouth.     hydroquinone  4 % cream APPLY  CREAM TOPICALLY TWICE DAILY 29 g 0   ibuprofen  (ADVIL ) 600 MG tablet Take 1 tablet (600 mg total) by mouth 2 (two) times daily as needed. 30 tablet 0   meloxicam  (MOBIC ) 15 MG tablet Take 1 tablet (15 mg total) by mouth daily. 30 tablet 1   semaglutide -weight  management (WEGOVY ) 1 MG/0.5ML SOAJ SQ injection Inject 1 mg into the skin once a week. 2 mL 0   triamcinolone  cream (KENALOG ) 0.1 % Apply 1 Application topically 2 (two) times daily. 30 g 1   Hydroquinone  6 % EMUL Apply 1 Application topically daily. (Patient not taking: Reported on 08/09/2024) 30 g 1   nitrofurantoin , macrocrystal-monohydrate, (MACROBID ) 100 MG capsule Take 1 capsule (100 mg total) by mouth 2 (two) times daily. (Patient not taking: Reported on 08/09/2024) 14 capsule 0   ondansetron  (ZOFRAN -ODT) 4 MG disintegrating tablet Take 1 tablet (4 mg total) by mouth every 8 (eight) hours as needed for nausea or vomiting. (Patient not taking: Reported on 08/09/2024) 20 tablet 0   Semaglutide -Weight Management (WEGOVY ) 0.5 MG/0.5ML SOAJ Inject 0.5 mg into the skin once a week. (Patient not taking: Reported on 08/09/2024) 2 mL 0   [START ON 08/22/2024] semaglutide -weight management (WEGOVY ) 1.7 MG/0.75ML SOAJ SQ injection Inject 1.7 mg into the skin once a week. (Patient not taking: Reported on 08/09/2024) 3 mL 0   [START ON 09/19/2024] semaglutide -weight management (WEGOVY ) 2.4 MG/0.75ML SOAJ SQ injection Inject 2.4 mg into the skin once a week. (Patient not taking:  Reported on 08/09/2024) 3 mL 1   tiZANidine  (ZANAFLEX ) 4 MG tablet Take 1 tablet (4 mg total) by mouth 2 (two) times daily as needed for muscle spasms. (Patient not taking: Reported on 08/09/2024) 15 tablet 0   No current facility-administered medications on file prior to visit.     The following portions of the patient's history were reviewed and updated as appropriate: allergies, current medications, past family history, past medical history, past social history, past surgical history and problem list.  ROS Otherwise as in subjective above  Objective: BP 122/82   Pulse 78   Wt 201 lb (91.2 kg)   SpO2 98%   BMI 31.48 kg/m   General appearance: alert, no distress, well developed, well nourished Abdomen: +bs, soft, non tender, non  distended, no masses, no hepatomegaly, no splenomegaly No obvious low back or mid back pain or tenderness There is tenderness of the right lower lateral rib cage but no bruising or deformity Inspiration/ expiration normal Pulses: 2+ radial pulses, 2+ pedal pulses, normal cap refill Ext: no edema   Assessment: Encounter Diagnoses  Name Primary?   Right sided abdominal pain Yes   Rib pain on right side      Plan: Right-sided abdominal pain and rib/chest wall pain We discussed possible causes Urinalysis reviewed, normal Advise stretching in general daily You can use Tylenol for pain as needed  Please go to [facility] for your rib/chest wall xray.   Their hours are 8am - 4:30 pm Monday - Friday.  Take your insurance card with you.  [facility] [phone]  [address] [place], [place] [address]  Nanci was seen today for acute visit.  Diagnoses and all orders for this visit:  Right sided abdominal pain -     POCT Urinalysis DIP (Proadvantage Device) -     DG Ribs Unilateral W/Chest Right  Rib pain on right side -     Cancel: DG Chest 2 View; Future -     DG Ribs Unilateral W/Chest Right    Follow up: pending xray

## 2024-08-09 NOTE — Patient Instructions (Signed)
 Right-sided abdominal pain and rib/chest wall pain We discussed possible causes Urinalysis reviewed, normal Advise stretching in general daily You can use Tylenol for pain as needed  Please go to [facility] for your rib/chest wall xray.   Their hours are 8am - 4:30 pm Monday - Friday.  Take your insurance card with you.  [facility] [phone]  [address] [place], [place] [address]

## 2024-08-12 ENCOUNTER — Ambulatory Visit
Admission: RE | Admit: 2024-08-12 | Discharge: 2024-08-12 | Disposition: A | Discharge status: Home / Self Care | Source: Ambulatory Visit | Attending: Medical | Admitting: Medical

## 2024-08-12 DIAGNOSIS — R0781 Pleurodynia: Secondary | ICD-10-CM | POA: Diagnosis not present

## 2024-08-25 ENCOUNTER — Ambulatory Visit: Payer: Self-pay | Admitting: Medical

## 2024-08-25 NOTE — Progress Notes (Signed)
 Results thru my chart

## 2024-08-30 ENCOUNTER — Telehealth: Payer: Self-pay | Admitting: Internal Medicine

## 2024-08-30 ENCOUNTER — Ambulatory Visit (INDEPENDENT_AMBULATORY_CARE_PROVIDER_SITE_OTHER): Admitting: Medical

## 2024-08-30 VITALS — BP 122/74 | HR 68 | Wt 198.8 lb

## 2024-08-30 DIAGNOSIS — Z0289 Encounter for other administrative examinations: Secondary | ICD-10-CM | POA: Diagnosis not present

## 2024-08-30 DIAGNOSIS — R109 Unspecified abdominal pain: Secondary | ICD-10-CM | POA: Diagnosis not present

## 2024-08-30 DIAGNOSIS — Z1211 Encounter for screening for malignant neoplasm of colon: Secondary | ICD-10-CM

## 2024-08-30 DIAGNOSIS — M545 Low back pain, unspecified: Secondary | ICD-10-CM | POA: Diagnosis not present

## 2024-08-30 LAB — POCT URINALYSIS DIP (PROADVANTAGE DEVICE)
Bilirubin, UA: NEGATIVE
Glucose, UA: NEGATIVE mg/dL
Ketones, POC UA: NEGATIVE mg/dL
Nitrite, UA: NEGATIVE
Protein Ur, POC: NEGATIVE mg/dL
Specific Gravity, Urine: 1.01
Urobilinogen, Ur: NEGATIVE
pH, UA: 6 (ref 5.0–8.0)

## 2024-08-30 NOTE — Telephone Encounter (Signed)
 Pt emailed forms to be filled out for FMLA leave. Placing in folder

## 2024-08-30 NOTE — Telephone Encounter (Signed)
 Spoke with patient.   You have to go through [employer] for FMLA forms and contact. I spoke to her about already giving more time than he usually gives for FMLA. She said if you can't do 5-7 days and can do 3-5 days like the past then that's fine. If she calls out because Alyce is having an behavior episode or something else then she gets points for attendances but if its on her fmla then she doesn't get it.   I will place this in your folder.  [employer] information- Phone [phone]

## 2024-08-30 NOTE — Progress Notes (Signed)
 Name: Dawn Goodwin   Date of Visit: 08/30/24   Date of last visit with me: 08/09/2024   CHIEF COMPLAINT:  Chief Complaint  Patient presents with   Acute Visit    Still having side and lower back pain, swelling under eye. Needs FMLA forms filled out and would like to have 2 additional days added versus just 5 days       HPI:  Discussed the use of AI scribe software for clinical note transcription with the patient, who gave verbal consent to proceed.  History of Present Illness  Dawn Goodwin is a 48 year old female who presents with right-sided pain and urinary frequency.  She has been experiencing right-sided pain since June, primarily affecting the lateral abdomen and low back, with radiation to her right arm and leg. The pain is described as throbbing and worsens when she holds her urine, particularly uncomfortable at night when the urge to urinate builds up. No pain in the ribs or upper back is noted.  She reports urinary frequency, urinating frequently during the day without any dysuria or hematuria. No recent bowel issues are noted.  She mentions a rash on her neck that appeared around the time of her father's funeral in June. The rash is described as puffed with wrinkles, which were present before the swelling occurred.  She expresses concern about her kidney health, mentioning past evaluations including a normal urine test on August 09, 2024, and a negative pregnancy test on July 09, 2024. Her last kidney marker test was in February.  She shares a family history of cancer, noting that her father was diagnosed with cancer after multiple doctor visits where nothing was initially detected.  No other aggravating or relieving factors. No other complaint.     Past Medical History:  Diagnosis Date   Acne    Back pain 2002   went through PT    GERD (gastroesophageal reflux disease)    intermittent   Headache    History of UTI    once prior as of 03/2015   Knee  pain 2007   right   Pregnancy induced hypertension    Seizure disorder (HCC)    from infancy til age 65yo, on medication during that time, etiology unclear?   Wears glasses    Current Outpatient Medications on File Prior to Visit  Medication Sig Dispense Refill   dimenhyDRINATE (MOTION SICKNESS PO) Take by mouth.     hydroquinone  4 % cream APPLY  CREAM TOPICALLY TWICE DAILY 29 g 0   meloxicam  (MOBIC ) 15 MG tablet Take 1 tablet (15 mg total) by mouth daily. 30 tablet 1   semaglutide -weight management (WEGOVY ) 1.7 MG/0.75ML SOAJ SQ injection Inject 1.7 mg into the skin once a week. 3 mL 0   triamcinolone  cream (KENALOG ) 0.1 % Apply 1 Application topically 2 (two) times daily. 30 g 1   Hydroquinone  6 % EMUL Apply 1 Application topically daily. (Patient not taking: Reported on 08/09/2024) 30 g 1   [START ON 09/19/2024] semaglutide -weight management (WEGOVY ) 2.4 MG/0.75ML SOAJ SQ injection Inject 2.4 mg into the skin once a week. (Patient not taking: Reported on 08/30/2024) 3 mL 1   No current facility-administered medications on file prior to visit.   Past Surgical History:  Procedure Laterality Date   CESAREAN SECTION  12/19/2004   DILATION AND CURETTAGE OF UTERUS  12/20/2011   DILATION AND EVACUATION  12/26/2011   Procedure: DILATATION AND EVACUATION;  Surgeon: Lamar CHRISTELLA Spence;  Location: [facility];  Service: Gynecology;  Laterality: N/A;   ROS as in subjective   Objective: BP 122/74   Pulse 68   Wt 198 lb 12.8 oz (90.2 kg)   BMI 31.14 kg/m   General appearence: alert, no distress, WD/WN,  Abdomen: +bs, soft, she is significantly tender in 1 specific area of the right lateral abdomen mid abdomen level just under the liver.  Otherwise non tender, non distended, no masses, no hepatomegaly, no splenomegaly Back: Mild tenderness in the right lumbar spine paraspinal around the L1 region but no obvious spasm or swelling or deformity.  No discoloration.  Otherwise back nontender with normal  range of motion. MSK: Legs nontender with normal range of motion.  No obvious deformity. Pulses: 2+ symmetric, upper and lower extremities, normal cap refill No ext edema   Rib xray 08/09/24: IMPRESSION: 1. No acute rib fracture. 2. No acute process in the lungs.  MRI Lumbar spine 02/25/22: IMPRESSION: 1. Mild degenerative changes at L4-5 with mild-moderate right subarticular recess stenosis. Correlate for radicular symptoms in the right L5 nerve root distribution. 2. No foraminal or canal stenosis at any level.    Assessment: Encounter Diagnoses  Name Primary?   Side pain Yes   Screening for colon cancer    Low back pain, unspecified back pain laterality, unspecified chronicity, unspecified whether sciatica present    Encounter for completion of form with patient    Abdominal pain, unspecified abdominal location      Plan: Right-sided abdominal and low back pain Chronic pain in mid abdomen/side on the right - Possible kidney, liver, or gallbladder involvement. - Order urine analysis and comprehensive metabolic panel including kidney and liver function tests. - CT scan of abdomen and pelvis  - Hold off on gastroenterology referral until lab results are available.  Urinary frequency Urinary frequency without dysuria or hematuria, considered with abdominal pain and possible kidney issue. - Order urine analysis, culture  Screening for colon cancer Pending labs and CT we may ultimately send for GI consult/ colonoscopy.  Her last Cologuard was negative in 2022.  FMLA forms I will review her forms and requests  Dawn Goodwin was seen today for acute visit.  Diagnoses and all orders for this visit:  Side pain -     POCT Urinalysis DIP (Proadvantage Device) -     CBC with Differential/Platelet -     Comprehensive metabolic panel with GFR -     Urine Culture -     CT ABDOMEN PELVIS W CONTRAST; Future  Screening for colon cancer  Low back pain, unspecified back pain  laterality, unspecified chronicity, unspecified whether sciatica present -     POCT Urinalysis DIP (Proadvantage Device) -     CBC with Differential/Platelet -     Comprehensive metabolic panel with GFR -     CT ABDOMEN PELVIS W CONTRAST; Future  Encounter for completion of form with patient  Abdominal pain, unspecified abdominal location -     CT ABDOMEN PELVIS W CONTRAST; Future    F/u pending labs

## 2024-08-30 NOTE — Telephone Encounter (Signed)
 Filled out much as I can. Pt would like 5-7 days out verses the 3-5 days on last FMLA forms you did back in the spring

## 2024-08-31 LAB — COMPREHENSIVE METABOLIC PANEL WITH GFR
ALT: 21 IU/L (ref 0–32)
AST: 17 IU/L (ref 0–40)
Albumin: 4.1 g/dL (ref 3.9–4.9)
Alkaline Phosphatase: 115 IU/L (ref 44–121)
BUN/Creatinine Ratio: 28 — AB (ref 9–23)
BUN: 17 mg/dL (ref 6–24)
Bilirubin Total: 0.3 mg/dL (ref 0.0–1.2)
CO2: 24 mmol/L (ref 20–29)
Calcium: 9.1 mg/dL (ref 8.7–10.2)
Chloride: 102 mmol/L (ref 96–106)
Creatinine, Ser: 0.6 mg/dL (ref 0.57–1.00)
Globulin, Total: 3.1 g/dL (ref 1.5–4.5)
Glucose: 79 mg/dL (ref 70–99)
Potassium: 3.9 mmol/L (ref 3.5–5.2)
Sodium: 139 mmol/L (ref 134–144)
Total Protein: 7.2 g/dL (ref 6.0–8.5)
eGFR: 111 mL/min/1.73 (ref >59)

## 2024-08-31 LAB — URINE CULTURE: Organism ID, Bacteria: NO GROWTH

## 2024-08-31 LAB — CBC WITH DIFFERENTIAL/PLATELET
Basophils Absolute: 0.1 x10E3/uL (ref 0.0–0.2)
Basos: 1 %
EOS (ABSOLUTE): 0.2 x10E3/uL (ref 0.0–0.4)
Eos: 3 %
Hematocrit: 39.2 % (ref 34.0–46.6)
Hemoglobin: 12.3 g/dL (ref 11.1–15.9)
Immature Grans (Abs): 0 x10E3/uL (ref 0.0–0.1)
Immature Granulocytes: 0 %
Lymphocytes Absolute: 2.8 x10E3/uL (ref 0.7–3.1)
Lymphs: 41 %
MCH: 29.6 pg (ref 26.6–33.0)
MCHC: 31.4 g/dL — ABNORMAL LOW (ref 31.5–35.7)
MCV: 94 fL (ref 79–97)
Monocytes Absolute: 0.6 x10E3/uL (ref 0.1–0.9)
Monocytes: 8 %
Neutrophils Absolute: 3.2 x10E3/uL (ref 1.4–7.0)
Neutrophils: 47 %
Platelets: 215 x10E3/uL (ref 150–450)
RBC: 4.16 x10E6/uL (ref 3.77–5.28)
RDW: 12.3 % (ref 11.7–15.4)
WBC: 6.9 x10E3/uL (ref 3.4–10.8)

## 2024-09-02 ENCOUNTER — Ambulatory Visit: Payer: Self-pay | Admitting: Medical

## 2024-09-02 ENCOUNTER — Encounter: Payer: Self-pay | Admitting: Medical

## 2024-09-02 NOTE — Progress Notes (Signed)
 Results through MyChart

## 2024-09-02 NOTE — Telephone Encounter (Signed)
 Spoke to patient and she states that Dawn Goodwin was going to see Psychiatrist in August but that provider doesn't do non-verbal so they wouldn't see him. I have reached out to Caryn to see if she can find somewhere that takes nonverbal and Autism patients in [place] as [place] doesn't want to drive far

## 2024-09-06 ENCOUNTER — Ambulatory Visit
Admission: RE | Admit: 2024-09-06 | Discharge: 2024-09-06 | Disposition: A | Discharge status: Home / Self Care | Source: Ambulatory Visit | Attending: Medical | Admitting: Medical

## 2024-09-06 DIAGNOSIS — M545 Low back pain, unspecified: Secondary | ICD-10-CM

## 2024-09-06 DIAGNOSIS — R109 Unspecified abdominal pain: Secondary | ICD-10-CM

## 2024-09-06 MED ORDER — IOPAMIDOL (ISOVUE-300) INJECTION 61%
100.0000 mL | Freq: Once | INTRAVENOUS | Status: AC | PRN
  Administered 2024-09-06: 100 mL via INTRAVENOUS

## 2024-09-09 NOTE — Progress Notes (Signed)
 Results thru my chart

## 2024-09-11 DIAGNOSIS — M545 Low back pain, unspecified: Secondary | ICD-10-CM

## 2024-09-11 DIAGNOSIS — R109 Unspecified abdominal pain: Secondary | ICD-10-CM

## 2024-09-27 ENCOUNTER — Ambulatory Visit: Admitting: Physician Assistant

## 2024-10-07 ENCOUNTER — Other Ambulatory Visit: Payer: Self-pay | Admitting: Family Medicine

## 2024-10-21 ENCOUNTER — Encounter: Payer: Self-pay | Admitting: Radiology

## 2024-11-19 ENCOUNTER — Telehealth: Payer: Self-pay | Admitting: Internal Medicine

## 2024-11-19 DIAGNOSIS — Z1211 Encounter for screening for malignant neoplasm of colon: Secondary | ICD-10-CM

## 2024-11-19 NOTE — Telephone Encounter (Signed)
Placed order for cologuard

## 2024-11-25 ENCOUNTER — Ambulatory Visit: Admitting: Medical

## 2024-12-16 ENCOUNTER — Telehealth: Payer: Self-pay | Admitting: Internal Medicine

## 2024-12-16 ENCOUNTER — Other Ambulatory Visit: Payer: Self-pay | Admitting: Medical

## 2024-12-16 MED ORDER — WEGOVY 2.4 MG/0.75ML ~~LOC~~ SOAJ: 2.4000 mg | SUBCUTANEOUS | 1 refills | Status: DC

## 2024-12-16 NOTE — Telephone Encounter (Signed)
Needs refill on wegovy.

## 2024-12-16 NOTE — Telephone Encounter (Signed)
 Pt was notified.

## 2024-12-24 ENCOUNTER — Telehealth: Payer: Self-pay | Admitting: Pharmacy Technician

## 2024-12-24 ENCOUNTER — Other Ambulatory Visit (HOSPITAL_COMMUNITY): Payer: Self-pay

## 2024-12-24 NOTE — Telephone Encounter (Signed)
 Clinical questions and answers have been submitted.

## 2024-12-24 NOTE — Telephone Encounter (Signed)
 Copied from CRM [id number]. Topic: Clinical - Medication Question >> Dec 24, 2024 12:41 PM Alfonso HERO wrote: Reason for CRM: [facility] states PA  rcvd for semaglutide -weight management (WEGOVY ) 2.4 MG/0.75ML SOAJ SQ injection and needs to know how much weigh loss with the medication has the patient had. Asking for a call back [phone]. You can speak to anyone when calling.

## 2024-12-24 NOTE — Telephone Encounter (Signed)
 New PA case number [id number] started over the phone with additional information faxed to [phone].

## 2024-12-24 NOTE — Telephone Encounter (Signed)
 Pharmacy Patient Advocate Encounter   Received notification from [facility] that prior authorization for Wegovy  2.4MG /0.75ML auto-injectors  is required/requested.   Insurance verification completed.   The patient is insured through [facility].   Per test claim: PA required; PA started via CoverMyMeds. KEY [id number] . Waiting for clinical questions to populate.

## 2024-12-26 ENCOUNTER — Other Ambulatory Visit (HOSPITAL_COMMUNITY): Payer: Self-pay

## 2024-12-26 NOTE — Telephone Encounter (Signed)
 Pharmacy Patient Advocate Encounter  Received notification from [facility] that Prior Authorization for Wegovy  2.4MG /0.75ML auto-injectors has been APPROVED from 12/24/2024 to 12/24/2025. Unable to obtain price due to refill too soon rejection, last fill date 12/25/2024 next available fill date01/28/2026.   PA #/Case ID/Reference #: [id number]

## 2025-01-03 ENCOUNTER — Telehealth: Payer: Self-pay

## 2025-01-03 NOTE — Telephone Encounter (Signed)
 Copied from CRM [id number]. Topic: General - Other >> Jan 03, 2025  1:56 PM Ahlexyia S wrote: Reason for CRM: Pt called in requesting to know the status for cologuard kit. Per pt chart the order was place on 11/19/24 and pt should receive by mail. Pt stated she hasn't received anything yet. Pt is requesting a callback or message via mychart regarding this.

## 2025-01-07 ENCOUNTER — Ambulatory Visit
Admission: RE | Admit: 2025-01-07 | Discharge: 2025-01-07 | Disposition: A | Source: Ambulatory Visit | Attending: Medical | Admitting: Medical

## 2025-01-07 DIAGNOSIS — Z1231 Encounter for screening mammogram for malignant neoplasm of breast: Secondary | ICD-10-CM

## 2025-01-10 ENCOUNTER — Other Ambulatory Visit: Payer: Self-pay | Admitting: Internal Medicine

## 2025-01-10 DIAGNOSIS — Z1211 Encounter for screening for malignant neoplasm of colon: Secondary | ICD-10-CM

## 2025-01-16 ENCOUNTER — Ambulatory Visit: Payer: Self-pay | Admitting: Medical

## 2025-01-21 ENCOUNTER — Encounter: Payer: Self-pay | Admitting: Medical

## 2025-01-21 ENCOUNTER — Telehealth: Admitting: Medical

## 2025-01-21 VITALS — Temp 97.4°F | Ht 67.0 in | Wt 188.0 lb

## 2025-01-21 DIAGNOSIS — Z6829 Body mass index (BMI) 29.0-29.9, adult: Secondary | ICD-10-CM

## 2025-01-21 DIAGNOSIS — M545 Low back pain, unspecified: Secondary | ICD-10-CM | POA: Diagnosis not present

## 2025-01-21 DIAGNOSIS — J069 Acute upper respiratory infection, unspecified: Secondary | ICD-10-CM | POA: Diagnosis not present

## 2025-01-21 DIAGNOSIS — E669 Obesity, unspecified: Secondary | ICD-10-CM

## 2025-01-21 DIAGNOSIS — R0989 Other specified symptoms and signs involving the circulatory and respiratory systems: Secondary | ICD-10-CM

## 2025-01-21 DIAGNOSIS — G8929 Other chronic pain: Secondary | ICD-10-CM

## 2025-01-21 DIAGNOSIS — E559 Vitamin D deficiency, unspecified: Secondary | ICD-10-CM | POA: Diagnosis not present

## 2025-01-21 DIAGNOSIS — Z6834 Body mass index (BMI) 34.0-34.9, adult: Secondary | ICD-10-CM

## 2025-01-21 MED ORDER — WEGOVY 2.4 MG/0.75ML ~~LOC~~ SOAJ: 2.4000 mg | SUBCUTANEOUS | 1 refills | Status: AC

## 2025-01-21 NOTE — Progress Notes (Signed)
 " Subjective:     Patient ID: Dawn Goodwin, female   DOB: [birth year], 49 y.o.   MRN: [id number]  Documentation for virtual audio and video telecommunications through Caregility encounter:  The patient was located at home. The provider was located in the office. The patient did consent to this visit and is aware of possible charges through their insurance for this visit.  The other persons participating in this telemedicine service were none. Time spent on call was 20 minutes and in review of previous records 20 minutes total.  This virtual service is not related to other E/M service within previous 7 days.   HPI Chief Complaint  Patient presents with   Nasal Congestion    VIRTUAL nasal congestion and drainage started Sunday, body aches yesterday but better today. No fever. No home testing.     Dawn Goodwin is a 49 year old female who presents with congestion and runny nose.  She has been experiencing congestion and rhinorrhea since Sunday 2 days ago, with nasal drainage leading to mucus in her throat, though not severe enough to cause nausea or vomiting. She has no fever and maintains a normal appetite.  On Sunday, she began taking Delsym every four hours and experienced intermittent sleep throughout the day. On Monday, she developed body aches, which have since resolved. She has been using Zyrtec, Sudafed, and Tylenol Severe Cold and Flu to manage her symptoms. Currently, she continues to have rhinorrhea but no longer has body aches.  She reports feeling chills, which she attributes to temperature differences between the floors of her home. She denies having a sore throat and has only minimal coughing. She has not been in contact with anyone known to have flu or COVID-19.  She reports having energy, as she is able to get up, cook, and move around. She missed work on Monday and Tuesday due to her symptoms but does not require a work note as her employer does not request  one.  Her current medications include Tylenol Severe Cold and Flu, Zyrtec, and Sudafed.  Needs refill on wegovy  for weight loss.  Still seeing success with this.  Continues with healthy eating habits and regular exercise  No other aggravating or relieving factors. No other complaint.   Review of Systems As in subjective    Objective:   Physical Exam This patient visit was virtual and they were not examined in person.   Temp (!) 97.4 F (36.3 C) (Tympanic)   Ht 5' 7 (1.702 m)   Wt 188 lb (85.3 kg)   BMI 29.44 kg/m   Gen: wd, wn, nad, somewhat ill appearing Congested sounding,  Otherwise pleasant, answers questions appropriately  Wt Readings from Last 3 Encounters:  01/21/25 188 lb (85.3 kg)  08/30/24 198 lb 12.8 oz (90.2 kg)  08/09/24 201 lb (91.2 kg)        Assessment:     Encounter Diagnoses  Name Primary?   Viral URI Yes   Runny nose    Vitamin D  deficiency    BMI 34.0-34.9,adult    Chronic bilateral low back pain without sciatica        Plan:      Acute upper respiratory infection Symptoms suggest viral etiology, likely common cold. Mild, self-limiting, expected resolution in 5-7 days. Current treatment includes multiple OTC medications, risk of duplication. - Continue Tylenol Severe Cold and Flu. - Discontinue other OTC medications to avoid duplication. - Consider nasal saline flush with Comfort Flow by Equate. -  Encourage hydration and rest. - Consider COVID-19 testing  -If not much improved within the next 72 hours then consider recheck  History of obesity, chronic back pain, vitamin D  deficiency -continue Wegovy  for weight loss measures along with healthy diet and exercise   Dawn Goodwin was seen today for nasal congestion.  Diagnoses and all orders for this visit:  Viral URI  Runny nose  Vitamin D  deficiency  BMI 34.0-34.9,adult  Chronic bilateral low back pain without sciatica  Other orders -     semaglutide -weight management (WEGOVY )  2.4 MG/0.75ML SOAJ SQ injection; Inject 2.4 mg into the skin once a week.    F/u 66mo  "

## 2025-02-05 ENCOUNTER — Encounter: Payer: Managed Care, Other (non HMO) | Admitting: Medical
